# Patient Record
Sex: Female | Born: 1980 | Race: White | Hispanic: No | Marital: Married | Smoking: Never smoker
Health system: Southern US, Community
[De-identification: ages and names within clinical notes are randomized; demographics above are authoritative.]

## PROBLEM LIST (undated history)

## (undated) DIAGNOSIS — D649 Anemia, unspecified: Secondary | ICD-10-CM

## (undated) DIAGNOSIS — N200 Calculus of kidney: Secondary | ICD-10-CM

## (undated) DIAGNOSIS — R011 Cardiac murmur, unspecified: Secondary | ICD-10-CM

## (undated) DIAGNOSIS — K589 Irritable bowel syndrome without diarrhea: Secondary | ICD-10-CM

## (undated) DIAGNOSIS — F419 Anxiety disorder, unspecified: Secondary | ICD-10-CM

## (undated) HISTORY — DX: Cardiac murmur, unspecified: R01.1

## (undated) HISTORY — DX: Anxiety disorder, unspecified: F41.9

## (undated) HISTORY — DX: Anemia, unspecified: D64.9

## (undated) HISTORY — DX: Irritable bowel syndrome, unspecified: K58.9

## (undated) HISTORY — DX: Calculus of kidney: N20.0

## (undated) HISTORY — PX: KIDNEY STONE SURGERY: SHX686

---

## 2004-05-13 HISTORY — PX: CHOLECYSTECTOMY: SHX55

## 2017-01-20 ENCOUNTER — Emergency Department (HOSPITAL_COMMUNITY)
Admission: EM | Admit: 2017-01-20 | Discharge: 2017-01-21 | Disposition: A | Discharge status: Home / Self Care | Payer: Self-pay | Attending: Emergency Medicine | Admitting: Emergency Medicine

## 2017-01-20 ENCOUNTER — Emergency Department (HOSPITAL_COMMUNITY): Payer: Self-pay

## 2017-01-20 DIAGNOSIS — T59811A Toxic effect of smoke, accidental (unintentional), initial encounter: Secondary | ICD-10-CM

## 2017-01-20 DIAGNOSIS — Z77028 Contact with and (suspected) exposure to other hazardous aromatic compounds: Secondary | ICD-10-CM | POA: Insufficient documentation

## 2017-01-20 DIAGNOSIS — J705 Respiratory conditions due to smoke inhalation: Secondary | ICD-10-CM | POA: Insufficient documentation

## 2017-01-20 LAB — COMPREHENSIVE METABOLIC PANEL
ALT: 17 U/L (ref 14–54)
AST: 19 U/L (ref 15–41)
Albumin: 4.4 g/dL (ref 3.5–5.0)
Alkaline Phosphatase: 58 U/L (ref 38–126)
Anion gap: 8 (ref 5–15)
BUN: 12 mg/dL (ref 6–20)
CHLORIDE: 109 mmol/L (ref 101–111)
CO2: 23 mmol/L (ref 22–32)
CREATININE: 0.81 mg/dL (ref 0.44–1.00)
Calcium: 9.2 mg/dL (ref 8.9–10.3)
GFR calc Af Amer: >60 mL/min (ref >60)
GFR calc non Af Amer: >60 mL/min (ref >60)
Glucose, Bld: 88 mg/dL (ref 65–99)
Potassium: 3.9 mmol/L (ref 3.5–5.1)
SODIUM: 140 mmol/L (ref 135–145)
TOTAL PROTEIN: 7.4 g/dL (ref 6.5–8.1)
Total Bilirubin: 0.8 mg/dL (ref 0.3–1.2)

## 2017-01-20 LAB — I-STAT CHEM 8, ED
BUN: 13 mg/dL (ref 6–20)
CALCIUM ION: 1.06 mmol/L — AB (ref 1.15–1.40)
CHLORIDE: 107 mmol/L (ref 101–111)
Creatinine, Ser: 0.7 mg/dL (ref 0.44–1.00)
GLUCOSE: 91 mg/dL (ref 65–99)
HCT: 38 % (ref 36.0–46.0)
HEMOGLOBIN: 12.9 g/dL (ref 12.0–15.0)
POTASSIUM: 4 mmol/L (ref 3.5–5.1)
SODIUM: 144 mmol/L (ref 135–145)
TCO2: 25 mmol/L (ref 22–32)

## 2017-01-20 LAB — I-STAT ARTERIAL BLOOD GAS, ED
Acid-Base Excess: 1 mmol/L (ref 0.0–2.0)
Bicarbonate: 25.3 mmol/L (ref 20.0–28.0)
O2 SAT: 97 %
PCO2 ART: 36.3 mmHg (ref 32.0–48.0)
TCO2: 26 mmol/L (ref 22–32)
pH, Arterial: 7.45 (ref 7.350–7.450)
pO2, Arterial: 85 mmHg (ref 83.0–108.0)

## 2017-01-20 LAB — CBC WITH DIFFERENTIAL/PLATELET
BASOS ABS: 0.1 10*3/uL (ref 0.0–0.1)
Basophils Relative: 1 %
EOS ABS: 0.1 10*3/uL (ref 0.0–0.7)
EOS PCT: 1 %
HCT: 35.3 % — ABNORMAL LOW (ref 36.0–46.0)
Hemoglobin: 11.4 g/dL — ABNORMAL LOW (ref 12.0–15.0)
Lymphocytes Relative: 35 %
Lymphs Abs: 4 10*3/uL (ref 0.7–4.0)
MCH: 20.2 pg — ABNORMAL LOW (ref 26.0–34.0)
MCHC: 32.3 g/dL (ref 30.0–36.0)
MCV: 62.7 fL — ABNORMAL LOW (ref 78.0–100.0)
MONO ABS: 0.8 10*3/uL (ref 0.1–1.0)
Monocytes Relative: 7 %
NEUTROS PCT: 56 %
Neutro Abs: 6.3 10*3/uL (ref 1.7–7.7)
PLATELETS: 251 10*3/uL (ref 150–400)
RBC: 5.63 MIL/uL — AB (ref 3.87–5.11)
RDW: 15.7 % — ABNORMAL HIGH (ref 11.5–15.5)
WBC: 11.3 10*3/uL — AB (ref 4.0–10.5)

## 2017-01-20 LAB — RAPID URINE DRUG SCREEN, HOSP PERFORMED
Amphetamines: NOT DETECTED
Barbiturates: NOT DETECTED
Benzodiazepines: NOT DETECTED
Cocaine: NOT DETECTED
Opiates: NOT DETECTED
Tetrahydrocannabinol: NOT DETECTED

## 2017-01-20 LAB — ETHANOL

## 2017-01-20 LAB — I-STAT BETA HCG BLOOD, ED (MC, WL, AP ONLY)

## 2017-01-20 LAB — COOXEMETRY PANEL
CARBOXYHEMOGLOBIN: 0.7 % (ref 0.5–1.5)
Methemoglobin: 1.1 % (ref 0.0–1.5)
O2 SAT: 59.1 %
Total hemoglobin: 11.7 g/dL — ABNORMAL LOW (ref 12.0–16.0)

## 2017-01-20 LAB — CBG MONITORING, ED: Glucose-Capillary: 101 mg/dL — ABNORMAL HIGH (ref 65–99)
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
## 2017-01-21 NOTE — Discharge Instructions (Signed)
Follow up with your doctor as needed for any persistent cough or concerning symptoms. Return here as needed.

## 2017-01-21 NOTE — ED Notes (Signed)
PA explained tests results and plan of care to pt. 

## 2019-06-03 DIAGNOSIS — R7989 Other specified abnormal findings of blood chemistry: Secondary | ICD-10-CM | POA: Diagnosis not present

## 2019-06-03 DIAGNOSIS — N200 Calculus of kidney: Secondary | ICD-10-CM | POA: Diagnosis not present

## 2019-06-03 DIAGNOSIS — R1032 Left lower quadrant pain: Secondary | ICD-10-CM | POA: Diagnosis not present

## 2019-06-03 DIAGNOSIS — Z87442 Personal history of urinary calculi: Secondary | ICD-10-CM | POA: Diagnosis not present

## 2019-06-03 DIAGNOSIS — N23 Unspecified renal colic: Secondary | ICD-10-CM | POA: Diagnosis not present

## 2019-06-03 DIAGNOSIS — Z793 Long term (current) use of hormonal contraceptives: Secondary | ICD-10-CM | POA: Diagnosis not present

## 2019-06-09 ENCOUNTER — Emergency Department
Admission: EM | Admit: 2019-06-09 | Discharge: 2019-06-09 | Disposition: A | Discharge status: Home / Self Care | Payer: BC Managed Care – PPO | Source: Home / Self Care

## 2019-06-09 ENCOUNTER — Other Ambulatory Visit: Payer: Self-pay

## 2019-06-09 DIAGNOSIS — M545 Low back pain, unspecified: Secondary | ICD-10-CM

## 2019-06-09 MED ORDER — METHOCARBAMOL 500 MG PO TABS: 500.0000 mg | ORAL_TABLET | Freq: Four times a day (QID) | ORAL | 0 refills | Status: DC

## 2019-06-09 MED ORDER — VALACYCLOVIR HCL 1 G PO TABS: 1000.0000 mg | ORAL_TABLET | Freq: Three times a day (TID) | ORAL | 0 refills | Status: AC

## 2019-06-09 MED ORDER — HYDROCODONE-ACETAMINOPHEN 5-325 MG PO TABS: 1.0000 | ORAL_TABLET | ORAL | 0 refills | Status: AC | PRN

## 2019-06-09 NOTE — Discharge Instructions (Signed)
Fill prescription for valtrex if you develop a rash

## 2019-06-09 NOTE — ED Triage Notes (Signed)
Pt c/o lower LT sided back pain since last Thurs. Also c/o some itching and burning sensation on same side. Some LT sided neck tenderness and swelling. Says son had chicken pox last week and she is wondering if its shingles.

## 2019-06-10 NOTE — ED Provider Notes (Signed)
[facility]    CSN: [id number] Arrival date & time: 06/09/19  1749      History   Chief Complaint Chief Complaint  Patient presents with  . Back Pain    HPI Shiva Karis is a 39 y.o. female.   Pt complains of pain in her left low back.  Pt reports she was seen at [facility] 1/21 because she thoughts she had a kidney stone.  Pt reports she had a normal scan.  Pt reports pain is worse and she has a bump in area.  Pt is worried that she has shingles.  Reports that area is stinging is painful.  Patient reports her child recently had chickenpox.  Patient denies any fever she denies any chills she has not had any cough  The history is provided by the patient. No language interpreter was used.  Back Pain   History reviewed. No pertinent past medical history.  There are no problems to display for this patient.   History reviewed. No pertinent surgical history.  OB History   No obstetric history on file.      Home Medications    Prior to Admission medications   Medication Sig Start Date End Date Taking? Authorizing Provider  HYDROcodone-acetaminophen (NORCO/VICODIN) 5-325 MG tablet Take 1 tablet by mouth every 4 (four) hours as needed for moderate pain. 06/09/19 06/08/20  Fransico Meadow, PA-C  methocarbamol (ROBAXIN) 500 MG tablet Take 1 tablet (500 mg total) by mouth 4 (four) times daily. 06/09/19   Fransico Meadow, PA-C  norgestimate-ethinyl estradiol (ORTHO-CYCLEN) 0.25-35 MG-MCG tablet Take by mouth.    [provider]  valACYclovir (VALTREX) 1000 MG tablet Take 1 tablet (1,000 mg total) by mouth 3 (three) times daily for 10 days. 06/09/19 06/19/19  Fransico Meadow, PA-C    Family History History reviewed. No pertinent family history.  Social History Social History   Tobacco Use  . Smoking status: Never Smoker  . Smokeless tobacco: Never Used  Substance Use Topics  . Alcohol use: Not on file  . Drug use: Not on file     Allergies     Patient has no known allergies.   Review of Systems Review of Systems  Musculoskeletal: Positive for back pain.  All other systems reviewed and are negative.    Physical Exam Triage Vital Signs ED Triage Vitals  Enc Vitals Group     BP 06/09/19 1830 127/81     Pulse Rate 06/09/19 1830 62     Resp 06/09/19 1830 18     Temp 06/09/19 1830 98.1 F (36.7 C)     Temp Source 06/09/19 1830 Oral     SpO2 06/09/19 1830 99 %     Weight 06/09/19 1831 168 lb (76.2 kg)     Height 06/09/19 1831 5\' 4"  (1.626 m)     Head Circumference --      Peak Flow --      Pain Score 06/09/19 1831 6     Pain Loc --      Pain Edu? --      Excl. in [place]? --    No data found.  Updated Vital Signs BP 127/81 (BP Location: Right Arm)   Pulse 62   Temp 98.1 F (36.7 C) (Oral)   Resp 18   Ht 5\' 4"  (1.626 m)   Wt 76.2 kg   LMP 06/09/2019 (Exact Date)   SpO2 99%   BMI 28.84 kg/m   Visual Acuity Right Eye  Distance:   Left Eye Distance:   Bilateral Distance:    Right Eye Near:   Left Eye Near:    Bilateral Near:     Physical Exam Vitals and nursing note reviewed.  Constitutional:      Appearance: She is well-developed.  HENT:     Head: Normocephalic.  Cardiovascular:     Rate and Rhythm: Normal rate.  Pulmonary:     Effort: Pulmonary effort is normal.  Abdominal:     General: There is no distension.  Musculoskeletal:        General: Normal range of motion.     Cervical back: Normal range of motion.  Neurological:     General: No focal deficit present.     Mental Status: She is alert and oriented to person, place, and time.  Psychiatric:        Mood and Affect: Mood normal.      UC Treatments / Results  Labs (all labs ordered are listed, but only abnormal results are displayed) Labs Reviewed - No data to display  EKG   Radiology No results found.  Procedures Procedures (including critical care time)  Medications Ordered in UC Medications - No data to  display  Initial Impression / Assessment and Plan / UC Course  I have reviewed the triage vital signs and the nursing notes.  Pertinent labs & imaging results that were available during my care of the patient were reviewed by me and considered in my medical decision making (see chart for details).     MDM: no rash. I doubt shingles.  Pt given rx for valtrex and advised only to fill if a rash. Final Clinical Impressions(s) / UC Diagnoses   Final diagnoses:  Acute left-sided low back pain without sciatica     Discharge Instructions     Fill prescription for valtrex if you develop a rash    ED Prescriptions    Medication Sig Dispense Auth. Provider   valACYclovir (VALTREX) 1000 MG tablet Take 1 tablet (1,000 mg total) by mouth 3 (three) times daily for 10 days. 30 tablet Neisha Hinger K, [place]   methocarbamol (ROBAXIN) 500 MG tablet Take 1 tablet (500 mg total) by mouth 4 (four) times daily. 20 tablet Flois Mctague K, [place]   HYDROcodone-acetaminophen (NORCO/VICODIN) 5-325 MG tablet Take 1 tablet by mouth every 4 (four) hours as needed for moderate pain. 12 tablet Elson Areas, [place]     I have reviewed the PDMP during this encounter.  An After Visit Summary was printed and given to the patient.    Elson Areas, [place] 06/10/19 1327

## 2019-08-12 DIAGNOSIS — H5213 Myopia, bilateral: Secondary | ICD-10-CM | POA: Diagnosis not present

## 2019-09-13 DIAGNOSIS — Z20828 Contact with and (suspected) exposure to other viral communicable diseases: Secondary | ICD-10-CM | POA: Diagnosis not present

## 2019-09-13 DIAGNOSIS — Z03818 Encounter for observation for suspected exposure to other biological agents ruled out: Secondary | ICD-10-CM | POA: Diagnosis not present

## 2019-09-23 DIAGNOSIS — R0602 Shortness of breath: Secondary | ICD-10-CM | POA: Diagnosis not present

## 2019-09-23 DIAGNOSIS — M940 Chondrocostal junction syndrome [Tietze]: Secondary | ICD-10-CM | POA: Diagnosis not present

## 2019-09-24 DIAGNOSIS — R5081 Fever presenting with conditions classified elsewhere: Secondary | ICD-10-CM | POA: Diagnosis not present

## 2019-09-24 DIAGNOSIS — J209 Acute bronchitis, unspecified: Secondary | ICD-10-CM | POA: Diagnosis not present

## 2019-09-26 DIAGNOSIS — Z20828 Contact with and (suspected) exposure to other viral communicable diseases: Secondary | ICD-10-CM | POA: Diagnosis not present

## 2019-09-26 DIAGNOSIS — Z03818 Encounter for observation for suspected exposure to other biological agents ruled out: Secondary | ICD-10-CM | POA: Diagnosis not present

## 2019-12-13 DIAGNOSIS — N760 Acute vaginitis: Secondary | ICD-10-CM | POA: Diagnosis not present

## 2020-01-25 DIAGNOSIS — D649 Anemia, unspecified: Secondary | ICD-10-CM | POA: Diagnosis not present

## 2020-01-25 DIAGNOSIS — R5383 Other fatigue: Secondary | ICD-10-CM | POA: Diagnosis not present

## 2020-05-29 DIAGNOSIS — J0100 Acute maxillary sinusitis, unspecified: Secondary | ICD-10-CM | POA: Diagnosis not present

## 2020-05-29 DIAGNOSIS — J029 Acute pharyngitis, unspecified: Secondary | ICD-10-CM | POA: Diagnosis not present

## 2020-05-29 DIAGNOSIS — Z6831 Body mass index (BMI) 31.0-31.9, adult: Secondary | ICD-10-CM | POA: Diagnosis not present

## 2020-06-01 DIAGNOSIS — Z20822 Contact with and (suspected) exposure to covid-19: Secondary | ICD-10-CM | POA: Diagnosis not present

## 2020-06-13 HISTORY — PX: COLONOSCOPY W/ BIOPSIES AND POLYPECTOMY: SHX1376

## 2020-06-14 DIAGNOSIS — R109 Unspecified abdominal pain: Secondary | ICD-10-CM | POA: Diagnosis not present

## 2020-06-14 DIAGNOSIS — R1013 Epigastric pain: Secondary | ICD-10-CM | POA: Diagnosis not present

## 2020-06-14 DIAGNOSIS — Z20822 Contact with and (suspected) exposure to covid-19: Secondary | ICD-10-CM | POA: Diagnosis not present

## 2020-06-14 DIAGNOSIS — Z79899 Other long term (current) drug therapy: Secondary | ICD-10-CM | POA: Diagnosis not present

## 2020-06-14 DIAGNOSIS — Z9049 Acquired absence of other specified parts of digestive tract: Secondary | ICD-10-CM | POA: Diagnosis not present

## 2020-06-14 DIAGNOSIS — Z793 Long term (current) use of hormonal contraceptives: Secondary | ICD-10-CM | POA: Diagnosis not present

## 2020-06-14 DIAGNOSIS — R1011 Right upper quadrant pain: Secondary | ICD-10-CM | POA: Diagnosis not present

## 2020-06-14 DIAGNOSIS — R110 Nausea: Secondary | ICD-10-CM | POA: Diagnosis not present

## 2020-06-26 DIAGNOSIS — R1011 Right upper quadrant pain: Secondary | ICD-10-CM | POA: Diagnosis not present

## 2020-06-26 DIAGNOSIS — A09 Infectious gastroenteritis and colitis, unspecified: Secondary | ICD-10-CM | POA: Diagnosis not present

## 2020-06-26 DIAGNOSIS — K921 Melena: Secondary | ICD-10-CM | POA: Diagnosis not present

## 2020-06-27 DIAGNOSIS — R1011 Right upper quadrant pain: Secondary | ICD-10-CM | POA: Diagnosis not present

## 2020-06-28 DIAGNOSIS — K2281 Esophageal polyp: Secondary | ICD-10-CM | POA: Diagnosis not present

## 2020-06-28 DIAGNOSIS — R1011 Right upper quadrant pain: Secondary | ICD-10-CM | POA: Diagnosis not present

## 2020-06-28 DIAGNOSIS — R197 Diarrhea, unspecified: Secondary | ICD-10-CM | POA: Diagnosis not present

## 2020-06-28 DIAGNOSIS — K648 Other hemorrhoids: Secondary | ICD-10-CM | POA: Diagnosis not present

## 2020-06-28 DIAGNOSIS — K2289 Other specified disease of esophagus: Secondary | ICD-10-CM | POA: Diagnosis not present

## 2020-06-28 DIAGNOSIS — R109 Unspecified abdominal pain: Secondary | ICD-10-CM | POA: Diagnosis not present

## 2020-06-28 DIAGNOSIS — D124 Benign neoplasm of descending colon: Secondary | ICD-10-CM | POA: Diagnosis not present

## 2020-06-28 DIAGNOSIS — K921 Melena: Secondary | ICD-10-CM | POA: Diagnosis not present

## 2020-10-02 DIAGNOSIS — J029 Acute pharyngitis, unspecified: Secondary | ICD-10-CM | POA: Diagnosis not present

## 2020-10-02 DIAGNOSIS — J020 Streptococcal pharyngitis: Secondary | ICD-10-CM | POA: Diagnosis not present

## 2020-10-02 DIAGNOSIS — Z6832 Body mass index (BMI) 32.0-32.9, adult: Secondary | ICD-10-CM | POA: Diagnosis not present

## 2021-04-09 ENCOUNTER — Ambulatory Visit: Payer: BC Managed Care – PPO | Admitting: Family Medicine

## 2021-04-16 ENCOUNTER — Ambulatory Visit: Payer: BC Managed Care – PPO | Admitting: Family Medicine

## 2021-04-16 ENCOUNTER — Ambulatory Visit (INDEPENDENT_AMBULATORY_CARE_PROVIDER_SITE_OTHER): Payer: BC Managed Care – PPO

## 2021-04-16 ENCOUNTER — Encounter: Payer: Self-pay | Admitting: Family Medicine

## 2021-04-16 VITALS — BP 113/71 | HR 88 | Temp 98.4°F | Ht 64.0 in | Wt 207.1 lb

## 2021-04-16 DIAGNOSIS — Z1159 Encounter for screening for other viral diseases: Secondary | ICD-10-CM

## 2021-04-16 DIAGNOSIS — M79652 Pain in left thigh: Secondary | ICD-10-CM | POA: Diagnosis not present

## 2021-04-16 DIAGNOSIS — Z7689 Persons encountering health services in other specified circumstances: Secondary | ICD-10-CM

## 2021-04-16 DIAGNOSIS — E559 Vitamin D deficiency, unspecified: Secondary | ICD-10-CM | POA: Diagnosis not present

## 2021-04-16 DIAGNOSIS — D563 Thalassemia minor: Secondary | ICD-10-CM | POA: Diagnosis not present

## 2021-04-16 DIAGNOSIS — Z6835 Body mass index (BMI) 35.0-35.9, adult: Secondary | ICD-10-CM

## 2021-04-16 DIAGNOSIS — E6609 Other obesity due to excess calories: Secondary | ICD-10-CM | POA: Diagnosis not present

## 2021-04-16 DIAGNOSIS — F411 Generalized anxiety disorder: Secondary | ICD-10-CM

## 2021-04-16 DIAGNOSIS — G2581 Restless legs syndrome: Secondary | ICD-10-CM | POA: Diagnosis not present

## 2021-04-16 DIAGNOSIS — Z114 Encounter for screening for human immunodeficiency virus [HIV]: Secondary | ICD-10-CM | POA: Diagnosis not present

## 2021-04-16 DIAGNOSIS — Z23 Encounter for immunization: Secondary | ICD-10-CM | POA: Diagnosis not present

## 2021-04-16 DIAGNOSIS — R102 Pelvic and perineal pain: Secondary | ICD-10-CM | POA: Diagnosis not present

## 2021-04-16 DIAGNOSIS — F321 Major depressive disorder, single episode, moderate: Secondary | ICD-10-CM

## 2021-04-16 DIAGNOSIS — E538 Deficiency of other specified B group vitamins: Secondary | ICD-10-CM | POA: Diagnosis not present

## 2021-04-16 MED ORDER — BUSPIRONE HCL 10 MG PO TABS: 10.0000 mg | ORAL_TABLET | Freq: Two times a day (BID) | ORAL | 2 refills | Status: DC

## 2021-04-16 MED ORDER — ROPINIROLE HCL 0.25 MG PO TABS: 0.2500 mg | ORAL_TABLET | Freq: Every day | ORAL | 1 refills | Status: DC

## 2021-04-16 NOTE — Progress Notes (Signed)
New Patient Office Visit  Subjective:  Patient ID: Amanda Clarke, female    DOB: 08/19/1980  Age: 40 y.o. MRN: [id number]  CC:  Chief Complaint  Patient presents with   New Patient (Initial Visit)    HPI Amanda Clarke presents to establish care.   She reports a constant need to move her legs at night or when she is resting. She also reports jerking and reports this makes it difficult. She reports that this has been going on for years. She has not tired any remedies for this. She takes a daily iron supplement. She has a history of thalassemia trait so her iron stores are often low.   She also reports intermittent sharp pain down the anterior of her left thigh. This occurs with certain movements such as getting out of the car or abduction of her left hip. She denies numbness, tingling, injury, or saddle anesthesia. This has been going on for 6 months. She has intermittent pain in her lower back. She has had sciatica in the past but this feels different.   She also reports a history of vitamin b12 and vitamin d deficiency.    She also has a history of anxiety. She has been under increased stress for the last few months due to some changes in her family situation. She has never taken medication for this. She does not feel like depression symptoms are really bothersome in her life but she does feel like anxiety affects her life.   Depression screen PHQ 2/9 04/16/2021  Decreased Interest 1  Down, Depressed, Hopeless 1  PHQ - 2 Score 2  Altered sleeping 2  Tired, decreased energy 3  Change in appetite 2  Feeling bad or failure about yourself  1  Trouble concentrating 0  Moving slowly or fidgety/restless 0  Suicidal thoughts 0  PHQ-9 Score 10  Difficult doing work/chores Somewhat difficult   GAD 7 : Generalized Anxiety Score 04/16/2021  Nervous, Anxious, on Edge 3  Control/stop worrying 2  Worry too much - different things 2  Trouble relaxing 2  Restless 1  Easily annoyed or  irritable 3  Afraid - awful might happen 1  Total GAD 7 Score 14  Anxiety Difficulty Somewhat difficult      Past Medical History:  Diagnosis Date   Anxiety    IBS (irritable bowel syndrome)    Recurrent kidney stones     Past Surgical History:  Procedure Laterality Date   CHOLECYSTECTOMY  2006   COLONOSCOPY W/ BIOPSIES AND POLYPECTOMY  06/2020   needs to repeat in 5 years   KIDNEY STONE SURGERY      Family History  Problem Relation Age of Onset   Depression Mother    Vision loss Father    Kidney disease Father    Heart disease Father    Diabetes Father     Social History   Socioeconomic History   Marital status: Married    Spouse name: Amanda Clarke   Number of children: 2   Years of education: 12   Highest education level: High school graduate  Occupational History   Not on file  Tobacco Use   Smoking status: Never   Smokeless tobacco: Never  Vaping Use   Vaping Use: Never used  Substance and Sexual Activity   Alcohol use: Yes    Alcohol/week: 1.0 standard drink    Types: 1 Glasses of wine per week   Drug use: Not Currently   Sexual activity: Yes  Birth control/protection: None  Other Topics Concern   Not on file  Social History Narrative   Not on file   Social Determinants of Health   Financial Resource Strain: Not on file  Food Insecurity: Not on file  Transportation Needs: Not on file  Physical Activity: Not on file  Stress: Not on file  Social Connections: Not on file  Intimate Partner Violence: Not on file    ROS Review of Systems Negative unless specially indicated above in HPI.  Objective:   Today's Vitals: BP 113/71   Pulse 88   Temp 98.4 F (36.9 C) (Temporal)   Ht _0  (1.626 m)   Wt 207 lb 2 oz (94 kg)   BMI 35.55 kg/m   Physical Exam Vitals and nursing note reviewed.  Constitutional:      General: She is not in acute distress.    Appearance: She is not ill-appearing, toxic-appearing or diaphoretic.  Neck:      Thyroid: No thyroid mass, thyromegaly or thyroid tenderness.  Cardiovascular:     Rate and Rhythm: Normal rate and regular rhythm.     Heart sounds: Normal heart sounds. No murmur heard. Pulmonary:     Effort: Pulmonary effort is normal. No respiratory distress.     Breath sounds: Normal breath sounds.  Abdominal:     General: Bowel sounds are normal. There is no distension.     Palpations: Abdomen is soft.     Tenderness: There is no abdominal tenderness. There is no guarding or rebound.  Musculoskeletal:     Cervical back: Normal, normal range of motion and neck supple.     Thoracic back: Normal.     Lumbar back: No edema or signs of trauma. Normal range of motion. Positive left straight leg raise test.     Right lower leg: No edema.     Left lower leg: No edema.     Comments: Tenderness to left SI joint.   Skin:    General: Skin is warm and dry.  Neurological:     General: No focal deficit present.     Mental Status: She is alert and oriented to person, place, and time.  Psychiatric:        Mood and Affect: Mood normal.    Assessment & Plan:   Clay was seen today for new patient (initial visit).  Diagnoses and all orders for this visit:  Restless leg syndrome Labs pending. Will try requip. She will let me know how she responds to this after 1 week to see if dose needs to be titrated.  -     Anemia Profile B -     CMP14+EGFR -     rOPINIRole (REQUIP) 0.25 MG tablet; Take 1 tablet (0.25 mg total) by mouth at bedtime.  Thalassemia trait Labs pending.  -     Anemia Profile B  Vitamin B12 deficiency On oral supplement. Labs pending.  -     Vitamin B12  Vitamin D deficiency On oral supplement. Labs pending.  -     VITAMIN D 25 Hydroxy (Vit-D Deficiency, Fractures)  Left thigh pain Discussed hip vs back as cause of pain. There is left sided SI joint tenderness and positive SLR on left side. Xray of pelvis pending.  -     DG Pelvis 1-2 Views; Future  Depression,  major, single episode, moderate ([place]) Declined treatment today. Denies SI.   Generalized anxiety disorder Start buspar BID as below.  -  busPIRone (BUSPAR) 10 MG tablet; Take 1 tablet (10 mg total) by mouth 2 (two) times daily.  Class 2 obesity due to excess calories without serious comorbidity with body mass index (BMI) of 35.0 to 35.9 in adult Diet and exercise. Labs pending. She did have coffee with cream and sugar 2.5 hours prior to labs today.  -     Anemia Profile B -     CMP14+EGFR -     Lipid panel -     TSH  Need for hepatitis C screening test -     Hepatitis C antibody  Encounter for screening for HIV -     HIV antibody (with reflex)  Need for vaccination Tdap today in office.  -     Tdap vaccine greater than or equal to 7yo IM  Need for immunization against influenza Flu vaccine today in office.  -     Flu Vaccine QUAD 63moIM (Fluarix, Fluzone & Alfiuria Quad PF)  Encounter to establish care Awaiting records.    Follow-up: Return in about 6 weeks (around 05/28/2021) for medication follow up.   The patient indicates understanding of these issues and agrees with the plan.  TGwenlyn Perking FNP

## 2021-04-16 NOTE — Patient Instructions (Signed)
Restless Legs Syndrome ?Restless legs syndrome is a condition that causes uncomfortable feelings or sensations in the legs, especially while sitting or lying down. The sensations usually cause an overwhelming urge to move the legs. The arms can also sometimes be affected. ?The condition can range from mild to severe. The symptoms often interfere with a person's ability to sleep. ?What are the causes? ?The cause of this condition is not known. ?What increases the risk? ?The following factors may make you more likely to develop this condition: ?Being older than 50. ?Pregnancy. ?Being a woman. In general, the condition is more common in women than in men. ?A family history of the condition. ?Having iron deficiency. ?Overuse of caffeine, nicotine, or alcohol. ?Certain medical conditions, such as kidney disease, Parkinson's disease, or nerve damage. ?Certain medicines, such as those for high blood pressure, nausea, colds, allergies, depression, and some heart conditions. ?What are the signs or symptoms? ?The main symptom of this condition is uncomfortable sensations in the legs, such as: ?Pulling. ?Tingling. ?Prickling. ?Throbbing. ?Crawling. ?Burning. ?Usually, the sensations: ?Affect both sides of the body. ?Are worse when you sit or lie down. ?Are worse at night. These may make it difficult to fall asleep. ?Make you have a strong urge to move your legs. ?Are temporarily relieved by moving your legs or standing. ?The arms can also be affected, but this is rare. People who have this condition often have tiredness during the day because of their lack of sleep at night. ?How is this diagnosed? ?This condition may be diagnosed based on: ?Your symptoms. ?Blood tests. ?In some cases, you may be monitored in a sleep lab by a specialist (a sleep study). This can detect any disruptions in your sleep. ?How is this treated? ?This condition is treated by managing the symptoms. This may include: ?Lifestyle changes, such as  exercising, using relaxation techniques, and avoiding caffeine, alcohol, or tobacco. ?Iron supplements. ?Medicines. Parkinson's medications may be tried first. Anti-seizure medications can also be helpful. ?Follow these instructions at home: ?General instructions ?Take over-the-counter and prescription medicines only as told by your health care provider. ?Use methods to help relieve the uncomfortable sensations, such as: ?Massaging your legs. ?Walking or stretching. ?Taking a cold or hot bath. ?Keep all follow-up visits. This is important. ?Lifestyle ?  ?Practice good sleep habits. For example, go to bed and get up at the same time every day. Most adults should get 7-9 hours of sleep each night. ?Exercise regularly. Try to get at least 30 minutes of exercise most days of the week. ?Practice ways of relaxing, such as yoga or meditation. ?Avoid caffeine and alcohol. ?Do not use any products that contain nicotine or tobacco. These products include cigarettes, chewing tobacco, and vaping devices, such as e-cigarettes. If you need help quitting, ask your health care provider. ?Where to find more information ?National Institute of Neurological Disorders and Stroke: www.ninds.nih.gov ?Contact a health care provider if: ?Your symptoms get worse or they do not improve with treatment. ?Summary ?Restless legs syndrome is a condition that causes uncomfortable feelings or sensations in the legs, especially while sitting or lying down. ?The symptoms often interfere with your ability to sleep. ?This condition is treated by managing the symptoms. You may need to make lifestyle changes or take medicines. ?This information is not intended to replace advice given to you by your health care provider. Make sure you discuss any questions you have with your health care provider. ?Document Revised: 12/10/2020 Document Reviewed: 12/10/2020 ?Elsevier Patient Education ? 2022   Elsevier Inc. ? ?

## 2021-04-17 LAB — LIPID PANEL
Chol/HDL Ratio: 2.4 ratio (ref 0.0–4.4)
Cholesterol, Total: 148 mg/dL (ref 100–199)
HDL: 62 mg/dL (ref >39)
LDL Chol Calc (NIH): 72 mg/dL (ref 0–99)
Triglycerides: 74 mg/dL (ref 0–149)
VLDL Cholesterol Cal: 14 mg/dL (ref 5–40)

## 2021-04-17 LAB — CMP14+EGFR
ALT: 20 IU/L (ref 0–32)
AST: 16 IU/L (ref 0–40)
Albumin/Globulin Ratio: 1.6 (ref 1.2–2.2)
Albumin: 4.3 g/dL (ref 3.8–4.8)
Alkaline Phosphatase: 104 IU/L (ref 44–121)
BUN/Creatinine Ratio: 18 (ref 9–23)
BUN: 12 mg/dL (ref 6–20)
Bilirubin Total: 0.4 mg/dL (ref 0.0–1.2)
CO2: 22 mmol/L (ref 20–29)
Calcium: 9.2 mg/dL (ref 8.7–10.2)
Chloride: 105 mmol/L (ref 96–106)
Creatinine, Ser: 0.66 mg/dL (ref 0.57–1.00)
Globulin, Total: 2.7 g/dL (ref 1.5–4.5)
Glucose: 86 mg/dL (ref 70–99)
Potassium: 4.4 mmol/L (ref 3.5–5.2)
Sodium: 140 mmol/L (ref 134–144)
Total Protein: 7 g/dL (ref 6.0–8.5)
eGFR: 115 mL/min/{1.73_m2} (ref >59)

## 2021-04-17 LAB — ANEMIA PROFILE B
Basophils Absolute: 0 10*3/uL (ref 0.0–0.2)
Basos: 0 %
EOS (ABSOLUTE): 0.1 10*3/uL (ref 0.0–0.4)
Eos: 1 %
Ferritin: 156 ng/mL — ABNORMAL HIGH (ref 15–150)
Folate: 11.8 ng/mL (ref >3.0)
Hematocrit: 37.6 % (ref 34.0–46.6)
Hemoglobin: 11.5 g/dL (ref 11.1–15.9)
Immature Grans (Abs): 0 10*3/uL (ref 0.0–0.1)
Immature Granulocytes: 0 %
Iron Saturation: 28 % (ref 15–55)
Iron: 82 ug/dL (ref 27–159)
Lymphocytes Absolute: 2.3 10*3/uL (ref 0.7–3.1)
Lymphs: 25 %
MCH: 19.9 pg — ABNORMAL LOW (ref 26.6–33.0)
MCHC: 30.6 g/dL — ABNORMAL LOW (ref 31.5–35.7)
MCV: 65 fL — ABNORMAL LOW (ref 79–97)
Monocytes Absolute: 0.7 10*3/uL (ref 0.1–0.9)
Monocytes: 7 %
Neutrophils Absolute: 6.4 10*3/uL (ref 1.4–7.0)
Neutrophils: 67 %
Platelets: 372 10*3/uL (ref 150–450)
RBC: 5.77 x10E6/uL — ABNORMAL HIGH (ref 3.77–5.28)
RDW: 17.9 % — ABNORMAL HIGH (ref 11.7–15.4)
Retic Ct Pct: 2.3 % (ref 0.6–2.6)
Total Iron Binding Capacity: 296 ug/dL (ref 250–450)
UIBC: 214 ug/dL (ref 131–425)
Vitamin B-12: 1556 pg/mL — ABNORMAL HIGH (ref 232–1245)
WBC: 9.5 10*3/uL (ref 3.4–10.8)

## 2021-04-17 LAB — TSH: TSH: 1.96 u[IU]/mL (ref 0.450–4.500)

## 2021-04-17 LAB — VITAMIN D 25 HYDROXY (VIT D DEFICIENCY, FRACTURES): Vit D, 25-Hydroxy: 55.7 ng/mL (ref 30.0–100.0)

## 2021-04-17 LAB — HEPATITIS C ANTIBODY: Hep C Virus Ab: <0.1 s/co ratio (ref 0.0–0.9)

## 2021-04-17 LAB — HIV ANTIBODY (ROUTINE TESTING W REFLEX): HIV Screen 4th Generation wRfx: NONREACTIVE

## 2021-04-18 NOTE — Addendum Note (Signed)
Addended by: Gabriel Earing on: 04/18/2021 09:55 AM   Modules accepted: Orders

## 2021-04-23 ENCOUNTER — Other Ambulatory Visit: Payer: Self-pay | Admitting: Family Medicine

## 2021-04-23 ENCOUNTER — Telehealth: Payer: Self-pay | Admitting: Family Medicine

## 2021-04-23 ENCOUNTER — Other Ambulatory Visit: Payer: Self-pay

## 2021-04-23 DIAGNOSIS — G2581 Restless legs syndrome: Secondary | ICD-10-CM

## 2021-04-23 MED ORDER — ROPINIROLE HCL 0.5 MG PO TABS: 0.5000 mg | ORAL_TABLET | Freq: Every day | ORAL | 1 refills | Status: DC

## 2021-04-23 NOTE — Telephone Encounter (Signed)
Left message informing pt and to call back if needed. 

## 2021-04-23 NOTE — Telephone Encounter (Signed)
0.5 mg tablet sent in. She can increase to 1 mg after 1 week if needed.

## 2021-04-23 NOTE — Telephone Encounter (Signed)
Pt called stating that she will try taking 2 (0.5mg ) tablets of her Ropinirole as advised and see if that helps her. Says she will call back to let us know so that we can send 1mg  Rx to pharmacy.  Pt says we sent Rx to wrong pharmacy today. Should have went to [facility].

## 2021-04-23 NOTE — Telephone Encounter (Signed)
Sent Requip to [facility]

## 2021-04-26 ENCOUNTER — Ambulatory Visit: Payer: BC Managed Care – PPO | Attending: Family Medicine

## 2021-04-27 ENCOUNTER — Other Ambulatory Visit: Payer: Self-pay | Admitting: Family Medicine

## 2021-04-27 DIAGNOSIS — G2581 Restless legs syndrome: Secondary | ICD-10-CM

## 2021-04-27 MED ORDER — ROPINIROLE HCL 1 MG PO TABS
1.0000 mg | ORAL_TABLET | Freq: Every day | ORAL | 1 refills | Status: DC
Start: 2021-04-27 — End: 2021-08-13

## 2021-05-28 ENCOUNTER — Encounter: Payer: Self-pay | Admitting: Family Medicine

## 2021-05-28 ENCOUNTER — Ambulatory Visit: Payer: BC Managed Care – PPO | Admitting: Family Medicine

## 2021-05-28 VITALS — BP 103/65 | HR 89 | Temp 97.9°F | Ht 64.0 in | Wt 211.2 lb

## 2021-05-28 DIAGNOSIS — F321 Major depressive disorder, single episode, moderate: Secondary | ICD-10-CM | POA: Diagnosis not present

## 2021-05-28 DIAGNOSIS — F411 Generalized anxiety disorder: Secondary | ICD-10-CM | POA: Diagnosis not present

## 2021-05-28 DIAGNOSIS — K2100 Gastro-esophageal reflux disease with esophagitis, without bleeding: Secondary | ICD-10-CM

## 2021-05-28 MED ORDER — DULOXETINE HCL 30 MG PO CPEP: 30.0000 mg | ORAL_CAPSULE | Freq: Every day | ORAL | 0 refills | Status: DC

## 2021-05-28 MED ORDER — ESOMEPRAZOLE MAGNESIUM 40 MG PO CPDR: 40.0000 mg | DELAYED_RELEASE_CAPSULE | Freq: Every day | ORAL | 5 refills | Status: DC
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
## 2021-06-04 ENCOUNTER — Encounter: Payer: Self-pay | Admitting: Nurse Practitioner

## 2021-06-04 ENCOUNTER — Ambulatory Visit: Payer: BC Managed Care – PPO | Admitting: Nurse Practitioner

## 2021-06-04 DIAGNOSIS — J069 Acute upper respiratory infection, unspecified: Secondary | ICD-10-CM | POA: Diagnosis not present

## 2021-06-04 MED ORDER — FLUTICASONE PROPIONATE 50 MCG/ACT NA SUSP: 2.0000 | Freq: Every day | NASAL | 6 refills | Status: DC

## 2021-06-04 NOTE — Progress Notes (Signed)
Virtual Visit  Note Due to COVID-19 pandemic this visit was conducted virtually. This visit type was conducted due to national recommendations for restrictions regarding the COVID-19 Pandemic (e.g. social distancing, sheltering in place) in an effort to limit this patient's exposure and mitigate transmission in our community. All issues noted in this document were discussed and addressed.  A physical exam was not performed with this format.  I connected with Amanda Clarke on 06/04/21 at 1:24 by telephone and verified that I am speaking with the correct person using two identifiers. Amanda Clarke is currently located at home and her kids is currently with her during visit. The provider, Mary-Margaret Hassell Done, FNP is located in their office at time of visit.  I discussed the limitations, risks, security and privacy concerns of performing an evaluation and management service by telephone and the availability of in person appointments. I also discussed with the patient that there may be a patient responsible charge related to this service. The patient expressed understanding and agreed to proceed.   History and Present Illness:  URI  The current episode started in the past 7 days. The problem has been unchanged. The maximum temperature recorded prior to her arrival was 103 - 104 F. The fever has been present for 3 to 4 days. Associated symptoms include congestion, coughing, headaches, rhinorrhea and sneezing. Pertinent negatives include no sinus pain. She has tried decongestant, acetaminophen and NSAIDs for the symptoms. The treatment provided moderate relief. Temp has come doen to 100 today. 3 home covid tests were negative.    Review of Systems  HENT:  Positive for congestion, rhinorrhea and sneezing. Negative for sinus pain.   Respiratory:  Positive for cough.   Neurological:  Positive for headaches.    Observations/Objective: Alert and oriented- answers all questions  appropriately No distress Raspy voice No cough  Assessment and Plan: Amanda Clarke in today with chief complaint of URI   1. URI with cough and congestion Patient probably had the flu and first onset with such a high fever. Out of treatment window for tamiflu 1. Take meds as prescribed 2. Use a cool mist humidifier especially during the winter months and when heat has been humid. 3. Use saline nose sprays frequently 4. Saline irrigations of the nose can be very helpful if done frequently.  * 4X daily for 1 week*  * Use of a nettie pot can be helpful with this. Follow directions with this* 5. Drink plenty of fluids 6. Keep thermostat turn down low 7.For any cough or congestion- delsym 8. For fever or aces or pains- take tylenol or ibuprofen appropriate for age and weight.  * for fevers greater than 101 orally you may alternate ibuprofen and tylenol every  3 hours. Add claritin daily to meds  Meds ordered this encounter  Medications   fluticasone (FLONASE) 50 MCG/ACT nasal spray    Sig: Place 2 sprays into both nostrils daily.    Dispense:  16 g    Refill:  6    Order Specific Question:   Supervising Provider    Answer:   Caryl Pina A [id number]   If fever spikes again, will NTBS    Follow Up Instructions: prn    I discussed the assessment and treatment plan with the patient. The patient was provided an opportunity to ask questions and all were answered. The patient agreed with the plan and demonstrated an understanding of the instructions.   The patient was advised to call  back or seek an in-person evaluation if the symptoms worsen or if the condition fails to improve as anticipated.  The above assessment and management plan was discussed with the patient. The patient verbalized understanding of and has agreed to the management plan. Patient is aware to call the clinic if symptoms persist or worsen. Patient is aware when to return to the clinic for a follow-up  visit. Patient educated on when it is appropriate to go to the emergency department.   Time call ended:  1:36  I provided 12 minutes of  non face-to-face time during this encounter.    Mary-Margaret Hassell Done, FNP

## 2021-06-05 ENCOUNTER — Other Ambulatory Visit: Payer: Self-pay | Admitting: Family Medicine

## 2021-06-05 DIAGNOSIS — G2581 Restless legs syndrome: Secondary | ICD-10-CM

## 2021-07-02 ENCOUNTER — Ambulatory Visit: Payer: BC Managed Care – PPO | Admitting: Family Medicine

## 2021-07-02 ENCOUNTER — Encounter: Payer: Self-pay | Admitting: Family Medicine

## 2021-07-02 VITALS — BP 114/73 | HR 115 | Temp 98.3°F | Ht 64.0 in | Wt 210.0 lb

## 2021-07-02 DIAGNOSIS — K2100 Gastro-esophageal reflux disease with esophagitis, without bleeding: Secondary | ICD-10-CM

## 2021-07-02 DIAGNOSIS — L2084 Intrinsic (allergic) eczema: Secondary | ICD-10-CM

## 2021-07-02 DIAGNOSIS — F321 Major depressive disorder, single episode, moderate: Secondary | ICD-10-CM

## 2021-07-02 MED ORDER — MIRTAZAPINE 30 MG PO TABS: 30.0000 mg | ORAL_TABLET | Freq: Every day | ORAL | 2 refills | Status: DC

## 2021-07-02 MED ORDER — FLUOCINONIDE 0.05 % EX CREA: 1.0000 "application " | TOPICAL_CREAM | Freq: Two times a day (BID) | CUTANEOUS | 5 refills | Status: DC

## 2021-07-02 MED ORDER — ESOMEPRAZOLE MAGNESIUM 40 MG PO CPDR: 40.0000 mg | DELAYED_RELEASE_CAPSULE | Freq: Two times a day (BID) | ORAL | 5 refills | Status: DC

## 2021-07-02 NOTE — Progress Notes (Signed)
Subjective:  Patient ID: Amanda Clarke, female    DOB: 01/30/1981  Age: 41 y.o. MRN: [id number]  CC: Follow-up (depression)   HPI Amanda Clarke presents for depression. No better. Would rather just stay in bed all the time. Trying to avoid activities such as getting mani-pedi with daughter.  Still having choking. Seems worse. Nexium ineffective. If laughing will regurgitate, choke and be unable to breathe for several seconds.   Can't sleep.   Depression screen [facility] 2/9 07/02/2021 05/28/2021 04/16/2021  Decreased Interest 2 2 1   Down, Depressed, Hopeless 2 1 1   PHQ - 2 Score 4 3 2   Altered sleeping 3 3 2   Tired, decreased energy 3 3 3   Change in appetite 1 2 2   Feeling bad or failure about yourself  2 1 1   Trouble concentrating 0 1 0  Moving slowly or fidgety/restless 0 1 0  Suicidal thoughts 1 0 0  PHQ-9 Score 14 14 10   Difficult doing work/chores Somewhat difficult Somewhat difficult Somewhat difficult    History Buddy has a past medical history of Anxiety, IBS (irritable bowel syndrome), and Recurrent kidney stones.   She has a past surgical history that includes Cholecystectomy (2006); Kidney stone surgery; and Colonoscopy w/ biopsies and polypectomy (06/2020).   Her family history includes Depression in her mother; Diabetes in her father; Heart disease in her father; Kidney disease in her father; Vision loss in her father.She reports that she has never smoked. She has never used smokeless tobacco. She reports current alcohol use of about 1.0 standard drink per week. She reports that she does not currently use drugs.    ROS Review of Systems  Constitutional: Negative.   HENT:  Positive for trouble swallowing.   Eyes:  Negative for visual disturbance.  Respiratory:  Negative for shortness of breath.   Cardiovascular:  Negative for chest pain.  Gastrointestinal:  Negative for abdominal pain.  Musculoskeletal:  Negative for arthralgias.  Skin:  Positive for  rash (left forearm).  Psychiatric/Behavioral:  Positive for sleep disturbance.    Objective:  BP 114/73    Pulse (!) 115    Temp 98.3 F (36.8 C)    Ht 5\' 4"  (1.626 m)    Wt 210 lb (95.3 kg)    SpO2 94%    BMI 36.05 kg/m   BP Readings from Last 3 Encounters:  07/02/21 114/73  05/28/21 103/65  04/16/21 113/71    Wt Readings from Last 3 Encounters:  07/02/21 210 lb (95.3 kg)  05/28/21 211 lb 3.2 oz (95.8 kg)  04/16/21 207 lb 2 oz (94 kg)     Physical Exam Constitutional:      General: She is not in acute distress.    Appearance: She is well-developed.  Cardiovascular:     Rate and Rhythm: Normal rate and regular rhythm.  Pulmonary:     Breath sounds: Normal breath sounds.  Musculoskeletal:        General: Normal range of motion.  Skin:    General: Skin is warm and dry.     Findings: Rash (maculopapular left forearm) present.  Neurological:     Mental Status: She is alert and oriented to person, place, and time.      Assessment & Plan:   Taeisha was seen today for follow-up.  Diagnoses and all orders for this visit:  Intrinsic eczema  Depression, major, single episode, moderate (HCC)  Gastroesophageal reflux disease with esophagitis without hemorrhage  Other orders -  esomeprazole (NEXIUM) 40 MG capsule; Take 1 capsule (40 mg total) by mouth 2 (two) times daily before a meal. -     mirtazapine (REMERON) 30 MG tablet; Take 1 tablet (30 mg total) by mouth at bedtime. -     fluocinonide cream (LIDEX) 0.05 %; Apply 1 application topically 2 (two) times daily.       I have discontinued Meredith Mody. Bowden's busPIRone and DULoxetine. I have also changed her esomeprazole. Additionally, I am having her start on mirtazapine and fluocinonide cream. Lastly, I am having her maintain her Cyanocobalamin (VITAMIN B-12 PO), FERROUS SULFATE PO, Cholecalciferol (VITAMIN D3 PO), CALCIUM PO, rOPINIRole, and fluticasone.  Allergies as of 07/02/2021   No Known Allergies       Medication List        Accurate as of July 02, 2021  2:49 PM. If you have any questions, ask your nurse or doctor.          STOP taking these medications    busPIRone 10 MG tablet Commonly known as: BUSPAR Stopped by: Claretta Fraise, MD   DULoxetine 30 MG capsule Commonly known as: Cymbalta Stopped by: Claretta Fraise, MD       TAKE these medications    CALCIUM PO Take by mouth.   esomeprazole 40 MG capsule Commonly known as: NexIUM Take 1 capsule (40 mg total) by mouth 2 (two) times daily before a meal. What changed: when to take this Changed by: Claretta Fraise, MD   FERROUS SULFATE PO Take by mouth.   fluocinonide cream 0.05 % Commonly known as: LIDEX Apply 1 application topically 2 (two) times daily. Started by: Claretta Fraise, MD   fluticasone 50 MCG/ACT nasal spray Commonly known as: FLONASE Place 2 sprays into both nostrils daily.   mirtazapine 30 MG tablet Commonly known as: Remeron Take 1 tablet (30 mg total) by mouth at bedtime. Started by: Claretta Fraise, MD   rOPINIRole 1 MG tablet Commonly known as: REQUIP Take 1 tablet (1 mg total) by mouth at bedtime.   VITAMIN B-12 PO Take by mouth.   VITAMIN D3 PO Take by mouth.         Follow-up: Return in about 1 month (around 07/30/2021).  Claretta Fraise, M.D.

## 2021-07-04 ENCOUNTER — Telehealth: Payer: Self-pay | Admitting: Family Medicine

## 2021-07-04 NOTE — Telephone Encounter (Signed)
It looks like you had started her on Mirtazapine 30mg  QHS and stopped the Buspar and Duloxetine. Pt is on Requip 1mg  for restless leg syndrome and had been doing well but she is saying the Mirtazapine has "reversed" the effectiveness of the Requip. Please advise.

## 2021-07-04 NOTE — Telephone Encounter (Signed)
Have her increase the ropinirole to 2 tabs at hs

## 2021-07-04 NOTE — Telephone Encounter (Signed)
Pt called stating that she recently had a visit with Dr Darlyn Read and started on some new medication.  Wanted to call and let Dr Darlyn Read know that the medication is not working for her. Says its even affecting her restless leg syndrome because since taking the new medicine, she has not been able to sleep in 2 days because her restless leg syndrome has been so bad.  Needs advise.

## 2021-07-05 NOTE — Telephone Encounter (Signed)
Left detailed voicemail

## 2021-08-01 DIAGNOSIS — R0789 Other chest pain: Secondary | ICD-10-CM | POA: Diagnosis not present

## 2021-08-01 DIAGNOSIS — R7989 Other specified abnormal findings of blood chemistry: Secondary | ICD-10-CM | POA: Diagnosis not present

## 2021-08-07 ENCOUNTER — Other Ambulatory Visit: Payer: Self-pay

## 2021-08-07 ENCOUNTER — Emergency Department (HOSPITAL_COMMUNITY): Payer: BC Managed Care – PPO

## 2021-08-07 ENCOUNTER — Observation Stay (HOSPITAL_COMMUNITY)
Admission: EM | Admit: 2021-08-07 | Discharge: 2021-08-09 | Disposition: A | Discharge status: Home / Self Care | Payer: BC Managed Care – PPO | Attending: Family Medicine | Admitting: Family Medicine

## 2021-08-07 ENCOUNTER — Encounter (HOSPITAL_COMMUNITY): Payer: Self-pay | Admitting: Emergency Medicine

## 2021-08-07 DIAGNOSIS — R109 Unspecified abdominal pain: Principal | ICD-10-CM | POA: Diagnosis present

## 2021-08-07 DIAGNOSIS — K589 Irritable bowel syndrome without diarrhea: Secondary | ICD-10-CM | POA: Diagnosis not present

## 2021-08-07 DIAGNOSIS — N200 Calculus of kidney: Secondary | ICD-10-CM | POA: Insufficient documentation

## 2021-08-07 DIAGNOSIS — R1032 Left lower quadrant pain: Secondary | ICD-10-CM | POA: Diagnosis not present

## 2021-08-07 DIAGNOSIS — R350 Frequency of micturition: Secondary | ICD-10-CM | POA: Diagnosis not present

## 2021-08-07 DIAGNOSIS — R1030 Lower abdominal pain, unspecified: Secondary | ICD-10-CM | POA: Diagnosis not present

## 2021-08-07 DIAGNOSIS — Z6835 Body mass index (BMI) 35.0-35.9, adult: Secondary | ICD-10-CM | POA: Diagnosis not present

## 2021-08-07 DIAGNOSIS — Z3202 Encounter for pregnancy test, result negative: Secondary | ICD-10-CM | POA: Diagnosis not present

## 2021-08-07 DIAGNOSIS — R1012 Left upper quadrant pain: Secondary | ICD-10-CM | POA: Diagnosis not present

## 2021-08-07 DIAGNOSIS — G2581 Restless legs syndrome: Secondary | ICD-10-CM | POA: Diagnosis present

## 2021-08-07 DIAGNOSIS — E6609 Other obesity due to excess calories: Secondary | ICD-10-CM | POA: Diagnosis not present

## 2021-08-07 DIAGNOSIS — Z87442 Personal history of urinary calculi: Secondary | ICD-10-CM | POA: Diagnosis not present

## 2021-08-07 LAB — COMPREHENSIVE METABOLIC PANEL
ALT: 26 U/L (ref 0–44)
AST: 20 U/L (ref 15–41)
Albumin: 3.8 g/dL (ref 3.5–5.0)
Alkaline Phosphatase: 79 U/L (ref 38–126)
Anion gap: 8 (ref 5–15)
BUN: 16 mg/dL (ref 6–20)
CO2: 24 mmol/L (ref 22–32)
Calcium: 9 mg/dL (ref 8.9–10.3)
Chloride: 106 mmol/L (ref 98–111)
Creatinine, Ser: 0.72 mg/dL (ref 0.44–1.00)
GFR, Estimated: >60 mL/min (ref >60)
Glucose, Bld: 118 mg/dL — ABNORMAL HIGH (ref 70–99)
Potassium: 3.7 mmol/L (ref 3.5–5.1)
Sodium: 138 mmol/L (ref 135–145)
Total Bilirubin: 0.5 mg/dL (ref 0.3–1.2)
Total Protein: 7 g/dL (ref 6.5–8.1)

## 2021-08-07 LAB — CBC WITH DIFFERENTIAL/PLATELET
Abs Immature Granulocytes: 0.06 10*3/uL (ref 0.00–0.07)
Basophils Absolute: 0 10*3/uL (ref 0.0–0.1)
Basophils Relative: 0 %
Eosinophils Absolute: 0.1 10*3/uL (ref 0.0–0.5)
Eosinophils Relative: 0 %
HCT: 35 % — ABNORMAL LOW (ref 36.0–46.0)
Hemoglobin: 11 g/dL — ABNORMAL LOW (ref 12.0–15.0)
Immature Granulocytes: 1 %
Lymphocytes Relative: 22 %
Lymphs Abs: 2.8 10*3/uL (ref 0.7–4.0)
MCH: 20.1 pg — ABNORMAL LOW (ref 26.0–34.0)
MCHC: 31.4 g/dL (ref 30.0–36.0)
MCV: 63.9 fL — ABNORMAL LOW (ref 80.0–100.0)
Monocytes Absolute: 0.7 10*3/uL (ref 0.1–1.0)
Monocytes Relative: 6 %
Neutro Abs: 9.5 10*3/uL — ABNORMAL HIGH (ref 1.7–7.7)
Neutrophils Relative %: 71 %
Platelets: 352 10*3/uL (ref 150–400)
RBC: 5.48 MIL/uL — ABNORMAL HIGH (ref 3.87–5.11)
RDW: 16.3 % — ABNORMAL HIGH (ref 11.5–15.5)
WBC: 13.2 10*3/uL — ABNORMAL HIGH (ref 4.0–10.5)
nRBC: 0 % (ref 0.0–0.2)

## 2021-08-07 LAB — URINALYSIS, ROUTINE W REFLEX MICROSCOPIC
Bilirubin Urine: NEGATIVE
Glucose, UA: NEGATIVE mg/dL
Ketones, ur: NEGATIVE mg/dL
Nitrite: NEGATIVE
Protein, ur: NEGATIVE mg/dL
Specific Gravity, Urine: 1.018 (ref 1.005–1.030)
pH: 5 (ref 5.0–8.0)

## 2021-08-07 LAB — I-STAT BETA HCG BLOOD, ED (MC, WL, AP ONLY): I-stat hCG, quantitative: <5 m[IU]/mL (ref <5)

## 2021-08-07 LAB — LIPASE, BLOOD: Lipase: 34 U/L (ref 11–51)

## 2021-08-07 MED ORDER — IOHEXOL 300 MG/ML  SOLN
100.0000 mL | Freq: Once | INTRAMUSCULAR | Status: AC | PRN
  Administered 2021-08-07: 100 mL via INTRAVENOUS

## 2021-08-07 MED ORDER — KETOROLAC TROMETHAMINE 15 MG/ML IJ SOLN
15.0000 mg | Freq: Once | INTRAMUSCULAR | Status: AC
Start: 2021-08-07 — End: 2021-08-08
  Administered 2021-08-08: 15 mg via INTRAVENOUS
  Filled 2021-08-07: qty 1

## 2021-08-07 MED ORDER — ONDANSETRON HCL 4 MG/2ML IJ SOLN
4.0000 mg | Freq: Once | INTRAMUSCULAR | Status: AC
  Administered 2021-08-08: 4 mg via INTRAVENOUS
  Filled 2021-08-07: qty 2

## 2021-08-07 MED ORDER — HYDROMORPHONE HCL 1 MG/ML IJ SOLN
0.5000 mg | Freq: Once | INTRAMUSCULAR | Status: AC
  Administered 2021-08-08: 0.5 mg via INTRAVENOUS
  Filled 2021-08-07: qty 1

## 2021-08-07 NOTE — ED Provider Triage Note (Signed)
Emergency Medicine Provider Triage Evaluation Note ? ?Amanda Clarke , a 41 y.o. female  was evaluated in triage.  Pt complains of abdominal pain. Patient states that she was having L lower chest pain about one week ago. She was seen by ED and had negative chest xray and sent home. She states that her pain has moved more towards the left lower quadrant and wraps around her back.  It has been worsening this week. She noticed that she has pain when having sex yesterday and had a small amount of blood after sex. Denies any vaginal discharge. She does endorse a fever at home as well. She has hx of kidney stones, but this is not like her normal stones. She went to urgent care today and found to have blood in urine. He sent her here for CT scan and further imaging.  ? ?Review of Systems  ?Positive: Abdominal pain, lower back pain, painful sex, postcoital bleeding ?Negative:  ? ?Physical Exam  ?BP 122/68 (BP Location: Left Arm)   Pulse 97   Temp 98 ?F (36.7 ?C) (Oral)   Resp 16   SpO2 97%  ?Gen:   Awake, no distress   ?Resp:  Normal effort  ?MSK:   Moves extremities without difficulty  ?Other:  LLQ tenderness, + left CVA tenderness. LUQ tenderness.  ? ?Medical Decision Making  ?Medically screening exam initiated at 9:47 PM.  Appropriate orders placed.  Amanda Clarke was informed that the remainder of the evaluation will be completed by another provider, this initial triage assessment does not replace that evaluation, and the importance of remaining in the ED until their evaluation is complete. ? ?Patient may also require pelvic exam once roomed due to dyspareunia.   ?  ?Claudie Leach, PA-C ?08/07/21 2152 ? ?

## 2021-08-07 NOTE — ED Provider Notes (Signed)
?[place] DEPT ?[facility] Emergency Department ?Provider Note ?MRN:  [id number]  ?Arrival date & time: 08/08/21    ? ?Chief Complaint   ?Abdominal Pain ?  ?History of Present Illness   ?Obie Scragg is a 41 y.o. year-old female with a history of IBS, kidney stones presenting to the ED with chief complaint of abdominal pain. ? ?Left upper quadrant abdominal pain for the past week.  At times it is located in the left lower quadrant as well.  Feels different from her kidney stone pain which she has experienced in the past.  Worse with palpation.  Sent here by PCP today.  Nausea but no vomiting, no bowel changes, has noted some hematuria. ? ?Review of Systems  ?A thorough review of systems was obtained and all systems are negative except as noted in the HPI and PMH.  ? ?Patient's Health History   ? ?Past Medical History:  ?Diagnosis Date  ? Anxiety   ? IBS (irritable bowel syndrome)   ? Recurrent kidney stones   ?  ?Past Surgical History:  ?Procedure Laterality Date  ? CHOLECYSTECTOMY  2006  ? COLONOSCOPY W/ BIOPSIES AND POLYPECTOMY  06/2020  ? needs to repeat in 5 years  ? KIDNEY STONE SURGERY    ?  ?Family History  ?Problem Relation Age of Onset  ? Depression Mother   ? Vision loss Father   ? Kidney disease Father   ? Heart disease Father   ? Diabetes Father   ?  ?Social History  ? ?Socioeconomic History  ? Marital status: Married  ?  Spouse name: Kersey Czyzewski  ? Number of children: 2  ? Years of education: 53  ? Highest education level: High school graduate  ?Occupational History  ? Not on file  ?Tobacco Use  ? Smoking status: Never  ? Smokeless tobacco: Never  ?Vaping Use  ? Vaping Use: Never used  ?Substance and Sexual Activity  ? Alcohol use: Yes  ?  Alcohol/week: 1.0 standard drink  ?  Types: 1 Glasses of wine per week  ? Drug use: Not Currently  ? Sexual activity: Yes  ?  Birth control/protection: None  ?Other Topics Concern  ? Not on file  ?Social History Narrative  ? Not on file  ? ?Social  Determinants of Health  ? ?Financial Resource Strain: Not on file  ?Food Insecurity: Not on file  ?Transportation Needs: Not on file  ?Physical Activity: Not on file  ?Stress: Not on file  ?Social Connections: Not on file  ?Intimate Partner Violence: Not on file  ?  ? ?Physical Exam  ? ?Vitals:  ? 08/08/21 0015 08/08/21 0130  ?BP: 129/66 102/65  ?Pulse: 92 (!) 104  ?Resp: 19 18  ?Temp:  98.3 ?F (36.8 ?C)  ?SpO2: 97% 96%  ?  ?CONSTITUTIONAL: Well-appearing, in moderate distress due to pain ?NEURO/PSYCH:  Alert and oriented x 3, no focal deficits ?EYES:  eyes equal and reactive ?ENT/NECK:  no LAD, no JVD ?CARDIO: Regular rate, well-perfused, normal S1 and S2 ?PULM:  CTAB no wheezing or rhonchi ?GI/GU:  non-distended, moderate tenderness to palpation to the left upper and lower quadrants ?MSK/SPINE:  No gross deformities, no edema ?SKIN:  no rash, atraumatic ? ? ?*Additional and/or pertinent findings included in MDM below ? ?Diagnostic and Interventional Summary  ? ? EKG Interpretation ? ?Date/Time:    ?Ventricular Rate:    ?PR Interval:    ?QRS Duration:   ?QT Interval:    ?QTC Calculation:   ?  R Axis:     ?Text Interpretation:   ?  ? ?  ? ?Labs Reviewed  ?CBC WITH DIFFERENTIAL/PLATELET - Abnormal; Notable for the following components:  ?    Result Value  ? WBC 13.2 (*)   ? RBC 5.48 (*)   ? Hemoglobin 11.0 (*)   ? HCT 35.0 (*)   ? MCV 63.9 (*)   ? MCH 20.1 (*)   ? RDW 16.3 (*)   ? Neutro Abs 9.5 (*)   ? All other components within normal limits  ?COMPREHENSIVE METABOLIC PANEL - Abnormal; Notable for the following components:  ? Glucose, Bld 118 (*)   ? All other components within normal limits  ?URINALYSIS, ROUTINE W REFLEX MICROSCOPIC - Abnormal; Notable for the following components:  ? APPearance CLOUDY (*)   ? Hgb urine dipstick LARGE (*)   ? Leukocytes,Ua SMALL (*)   ? Bacteria, UA FEW (*)   ? All other components within normal limits  ?URINE CULTURE  ?LIPASE, BLOOD  ?I-STAT BETA HCG BLOOD, ED ([facility], [facility], [facility] ONLY)  ?   ?CT Abdomen Pelvis W Contrast  ?Final Result  ?  ?US Pelvis Complete  ?Final Result  ?  ?US Transvaginal Non-OB  ?Final Result  ?  ?[place] Art/Ven Flow Abd Pelv Doppler  ?Final Result  ?  ?  ?Medications  ?HYDROmorphone (DILAUDID) injection 1 mg (has no administration in time range)  ?iohexol (OMNIPAQUE) 300 MG/ML solution 100 mL (100 mLs Intravenous Contrast Given 08/07/21 2322)  ?ondansetron [facility]) injection 4 mg (4 mg Intravenous Given 08/08/21 0012)  ?HYDROmorphone (DILAUDID) injection 0.5 mg (0.5 mg Intravenous Given 08/08/21 0013)  ?ketorolac (TORADOL) 15 MG/ML injection 15 mg (15 mg Intravenous Given 08/08/21 0013)  ?cefTRIAXone (ROCEPHIN) 2 g in sodium chloride 0.9 % 100 mL IVPB (0 g Intravenous Stopped 08/08/21 0141)  ?diphenhydrAMINE (BENADRYL) injection 25 mg (25 mg Intravenous Given 08/08/21 0059)  ?HYDROmorphone (DILAUDID) injection 1 mg (1 mg Intravenous Given 08/08/21 0146)  ?famotidine (PEPCID) IVPB 20 mg premix (0 mg Intravenous Stopped 08/08/21 0150)  ?  ? ?Procedures  /  Critical Care ?Procedures ? ?ED Course and Medical Decision Making  ?Initial Impression and Ddx ?Differential diagnosis includes kidney stone, splenic infarct, diverticulitis, gastritis, pancreatitis, pyelonephritis.  Reassuring vital signs, awaiting labs, CT imaging.  Ultrasound obtained in triage is without evidence of ovarian cyst or torsion. ? ?Past medical/surgical history that increases complexity of ED encounter: History of kidney stones ? ?Interpretation of Diagnostics ?I personally reviewed the laboratory assessment and my interpretation is as follows: No significant blood count or electrolyte disturbances noted on CBC/BMP.  hCG is negative.  Lipase normal.  Urinalysis with blood, leuk esterase positive, however multiple squamous cells and so question contaminated sample. ?   ?CT imaging is without acute abnormalities. ? ?Patient Reassessment and Ultimate Disposition/Management ?Continued pain despite multiple rounds of Dilaudid,  also tried Pepcid, Toradol.  Will admit to medicine for intractable pain.  Question pyelonephritis. ? ?Patient management required discussion with the following services or consulting groups:  Hospitalist Service ? ?Complexity of Problems Addressed ?Acute illness or injury that poses threat of life of bodily function ? ?Additional Data Reviewed and Analyzed ?Further history obtained from: ?Further history from spouse/family member ? ?Additional Factors Impacting ED Encounter Risk ?Consideration of hospitalization ? ?Barth Kirks. Sedonia Small, MD ?[facility] Emergency Medicine ?[place] [email or web address] ? ?Final Clinical Impressions(s) / ED Diagnoses  ? ?  ICD-10-CM   ?1. Flank pain  R10.9   ?  ?  ?  ED Discharge Orders   ? ? None  ? ?  ?  ? ?Discharge Instructions Discussed with and Provided to Patient:  ? ?Discharge Instructions   ?None ?  ? ?  ?Maudie Flakes, MD ?08/08/21 [phone] ? ?

## 2021-08-07 NOTE — ED Triage Notes (Signed)
Pt sent to ED from [facility] for further evaluation of LUQ abdominal pain. Pt states pain has been present for 1 week and increased in severity today. Endorses nausea, no active vomiting. Denies CP or SHOB.  ?

## 2021-08-08 ENCOUNTER — Encounter (HOSPITAL_COMMUNITY): Payer: Self-pay | Admitting: Internal Medicine

## 2021-08-08 DIAGNOSIS — F419 Anxiety disorder, unspecified: Secondary | ICD-10-CM | POA: Diagnosis not present

## 2021-08-08 DIAGNOSIS — R1012 Left upper quadrant pain: Secondary | ICD-10-CM | POA: Diagnosis not present

## 2021-08-08 DIAGNOSIS — R1032 Left lower quadrant pain: Secondary | ICD-10-CM | POA: Diagnosis not present

## 2021-08-08 DIAGNOSIS — F32A Depression, unspecified: Secondary | ICD-10-CM | POA: Diagnosis not present

## 2021-08-08 DIAGNOSIS — R109 Unspecified abdominal pain: Secondary | ICD-10-CM | POA: Diagnosis not present

## 2021-08-08 LAB — CBC WITH DIFFERENTIAL/PLATELET
Abs Immature Granulocytes: 0.07 10*3/uL (ref 0.00–0.07)
Basophils Absolute: 0 10*3/uL (ref 0.0–0.1)
Basophils Relative: 0 %
Eosinophils Absolute: 0.1 10*3/uL (ref 0.0–0.5)
Eosinophils Relative: 0 %
HCT: 32.3 % — ABNORMAL LOW (ref 36.0–46.0)
Hemoglobin: 10.3 g/dL — ABNORMAL LOW (ref 12.0–15.0)
Immature Granulocytes: 1 %
Lymphocytes Relative: 27 %
Lymphs Abs: 3.2 10*3/uL (ref 0.7–4.0)
MCH: 20.2 pg — ABNORMAL LOW (ref 26.0–34.0)
MCHC: 31.9 g/dL (ref 30.0–36.0)
MCV: 63.2 fL — ABNORMAL LOW (ref 80.0–100.0)
Monocytes Absolute: 1 10*3/uL (ref 0.1–1.0)
Monocytes Relative: 8 %
Neutro Abs: 7.6 10*3/uL (ref 1.7–7.7)
Neutrophils Relative %: 64 %
Platelets: 318 10*3/uL (ref 150–400)
RBC: 5.11 MIL/uL (ref 3.87–5.11)
RDW: 16.1 % — ABNORMAL HIGH (ref 11.5–15.5)
WBC: 12 10*3/uL — ABNORMAL HIGH (ref 4.0–10.5)
nRBC: 0 % (ref 0.0–0.2)

## 2021-08-08 LAB — COMPREHENSIVE METABOLIC PANEL
ALT: 23 U/L (ref 0–44)
AST: 16 U/L (ref 15–41)
Albumin: 3.4 g/dL — ABNORMAL LOW (ref 3.5–5.0)
Alkaline Phosphatase: 78 U/L (ref 38–126)
Anion gap: 10 (ref 5–15)
BUN: 13 mg/dL (ref 6–20)
CO2: 23 mmol/L (ref 22–32)
Calcium: 8.8 mg/dL — ABNORMAL LOW (ref 8.9–10.3)
Chloride: 106 mmol/L (ref 98–111)
Creatinine, Ser: 0.69 mg/dL (ref 0.44–1.00)
GFR, Estimated: >60 mL/min (ref >60)
Glucose, Bld: 120 mg/dL — ABNORMAL HIGH (ref 70–99)
Potassium: 3.7 mmol/L (ref 3.5–5.1)
Sodium: 139 mmol/L (ref 135–145)
Total Bilirubin: 0.5 mg/dL (ref 0.3–1.2)
Total Protein: 6.5 g/dL (ref 6.5–8.1)

## 2021-08-08 LAB — LIPASE, BLOOD: Lipase: 27 U/L (ref 11–51)

## 2021-08-08 LAB — LACTATE DEHYDROGENASE: LDH: 103 U/L (ref 98–192)

## 2021-08-08 LAB — MAGNESIUM: Magnesium: 1.9 mg/dL (ref 1.7–2.4)

## 2021-08-08 MED ORDER — HYDROMORPHONE HCL 1 MG/ML IJ SOLN
1.0000 mg | Freq: Once | INTRAMUSCULAR | Status: AC
  Administered 2021-08-08: 1 mg via INTRAVENOUS
  Filled 2021-08-08: qty 1

## 2021-08-08 MED ORDER — SODIUM CHLORIDE 0.9 % IV SOLN
2.0000 g | Freq: Once | INTRAVENOUS | Status: AC
Start: 2021-08-08 — End: 2021-08-08
  Administered 2021-08-08: 2 g via INTRAVENOUS
  Filled 2021-08-08: qty 20

## 2021-08-08 MED ORDER — LACTATED RINGERS IV SOLN: INTRAVENOUS | Status: DC

## 2021-08-08 MED ORDER — ACETAMINOPHEN 325 MG PO TABS: 650.0000 mg | ORAL_TABLET | Freq: Four times a day (QID) | ORAL | Status: DC | PRN

## 2021-08-08 MED ORDER — NALOXONE HCL 0.4 MG/ML IJ SOLN
0.4000 mg | INTRAMUSCULAR | Status: DC | PRN
Start: 2021-08-08 — End: 2021-08-08

## 2021-08-08 MED ORDER — BISACODYL 5 MG PO TBEC
5.0000 mg | DELAYED_RELEASE_TABLET | Freq: Every day | ORAL | Status: DC | PRN
  Filled 2021-08-08: qty 1

## 2021-08-08 MED ORDER — KETOROLAC TROMETHAMINE 30 MG/ML IJ SOLN
30.0000 mg | Freq: Four times a day (QID) | INTRAMUSCULAR | Status: DC | PRN
  Administered 2021-08-08 – 2021-08-09 (×2): 30 mg via INTRAVENOUS
  Filled 2021-08-08 (×2): qty 1

## 2021-08-08 MED ORDER — FAMOTIDINE IN NACL 20-0.9 MG/50ML-% IV SOLN
20.0000 mg | Freq: Once | INTRAVENOUS | Status: AC
  Administered 2021-08-08: 20 mg via INTRAVENOUS
  Filled 2021-08-08: qty 50

## 2021-08-08 MED ORDER — ENOXAPARIN SODIUM 40 MG/0.4ML IJ SOSY
40.0000 mg | PREFILLED_SYRINGE | INTRAMUSCULAR | Status: DC
  Administered 2021-08-08 – 2021-08-09 (×2): 40 mg via SUBCUTANEOUS
  Filled 2021-08-08 (×2): qty 0.4

## 2021-08-08 MED ORDER — SALINE SPRAY 0.65 % NA SOLN
1.0000 | NASAL | Status: DC | PRN
Start: 2021-08-08 — End: 2021-08-09
  Filled 2021-08-08: qty 44

## 2021-08-08 MED ORDER — POLYETHYLENE GLYCOL 3350 17 G PO PACK: 17.0000 g | PACK | Freq: Every day | ORAL | Status: DC | PRN

## 2021-08-08 MED ORDER — DIPHENHYDRAMINE HCL 50 MG/ML IJ SOLN
25.0000 mg | Freq: Once | INTRAMUSCULAR | Status: AC
  Administered 2021-08-08: 25 mg via INTRAVENOUS
  Filled 2021-08-08: qty 1

## 2021-08-08 MED ORDER — ONDANSETRON HCL 4 MG/2ML IJ SOLN: 4.0000 mg | Freq: Four times a day (QID) | INTRAMUSCULAR | Status: DC | PRN

## 2021-08-08 MED ORDER — ACETAMINOPHEN 650 MG RE SUPP: 650.0000 mg | Freq: Four times a day (QID) | RECTAL | Status: DC | PRN

## 2021-08-08 MED ORDER — FLUTICASONE PROPIONATE 50 MCG/ACT NA SUSP
2.0000 | Freq: Every day | NASAL | Status: DC
  Administered 2021-08-09: 2 via NASAL
  Filled 2021-08-08: qty 16

## 2021-08-08 MED ORDER — KETOROLAC TROMETHAMINE 15 MG/ML IJ SOLN: 15.0000 mg | Freq: Four times a day (QID) | INTRAMUSCULAR | Status: DC | PRN

## 2021-08-08 MED ORDER — ROPINIROLE HCL 0.5 MG PO TABS
1.0000 mg | ORAL_TABLET | Freq: Every day | ORAL | Status: DC
  Administered 2021-08-08: 1 mg via ORAL
  Filled 2021-08-08: qty 2
  Filled 2021-08-08: qty 1

## 2021-08-08 MED ORDER — HYDROMORPHONE HCL 1 MG/ML IJ SOLN: 0.5000 mg | INTRAMUSCULAR | Status: DC | PRN

## 2021-08-08 MED ORDER — HYDRALAZINE HCL 20 MG/ML IJ SOLN
5.0000 mg | INTRAMUSCULAR | Status: DC | PRN
Start: 2021-08-08 — End: 2021-08-09
  Filled 2021-08-08: qty 0.25

## 2021-08-08 MED ORDER — DIPHENHYDRAMINE HCL 25 MG PO CAPS
25.0000 mg | ORAL_CAPSULE | Freq: Four times a day (QID) | ORAL | Status: DC | PRN
  Administered 2021-08-08 (×2): 25 mg via ORAL
  Filled 2021-08-08 (×2): qty 1

## 2021-08-08 MED ORDER — DOCUSATE SODIUM 100 MG PO CAPS
100.0000 mg | ORAL_CAPSULE | Freq: Two times a day (BID) | ORAL | Status: DC
  Administered 2021-08-08 – 2021-08-09 (×3): 100 mg via ORAL
  Filled 2021-08-08 (×3): qty 1

## 2021-08-08 NOTE — Assessment & Plan Note (Signed)
-  Weight loss should be encouraged °-Outpatient PCP/bariatric medicine/bariatric surgery f/u encouraged °

## 2021-08-08 NOTE — Progress Notes (Signed)
?  Carryover admission to the Day Admitter.  I discussed this case with the EDP, Dr.Bero.  Per these discussions: ? ?This is a 41 year old female with history of kidney stones who is being admitted for overnight observation and for intractable abdominal discomfort after presenting with 1 week of progressive left-sided abdominal discomfort.  Initially, she noted her abdominal pain to be limited to the left upper quadrant, starting 1 week ago, but with ensuing increase in involved distribution, not reporting abdominal discomfort and acute left upper and left lower quadrant.  She notes increasing intensity over that timeframe, with pain subsequently becoming constant over the last 2 to 3 days.  She notes a prior history of kidney stones, but states that a abdominal pain with which she presents over the course of the last week, is different than that which she experienced at times of prior kidney stones, including from a quality, intensity, location, and exacerbation standpoint, will noting exacerbation of her presenting abdominal discomfort with palpation.  ? ?VS reportedly stable.  Urinalysis showed 6-10 white blood cells, but continued with 21-50 squamous epithelial cells, concerning for contaminated specimen. ? ?Abdominal ultrasound showed no evidence of ovarian pathology nor any evidence of ovarian torsion.  CT abdomen/pelvis reportedly showed no evidence of acute intra-abdominal process, including no evidence of pancreatitis or evidence of bowel obstruction, abscess, or perforation.  No evidence of kidney stones or hydronephrosis. ? ?Continues to report significant abdominal discomfort following 3 doses of IV Dilaudid as well as a dose of IV Toradol in the emergency department.  ? ?I have placed an order to admit for overnight observation for intractable left-sided abdominal discomfort.  ? ?I have placed some additional preliminary admit orders via the adult multi-morbid admission order set. I have also ordered prn  IV Dilaudid, as needed IV Toradol, prn Zofran. I have ordered LDH as well as repeat lipase. Additionally, have ordered fresh set of AM labs.  ? ? ? ?Newton Pigg, DO ?Hospitalist ? ?

## 2021-08-08 NOTE — Assessment & Plan Note (Signed)
Continue Requip. 

## 2021-08-08 NOTE — Plan of Care (Signed)

## 2021-08-08 NOTE — Consult Note (Signed)
Unassigned patient  ?Reason for Consult: Left lower quadrant pain. ?Referring Physician: [facility] hospitalist. ? ?Amanda Clarke is an 41 y.o. female.  ?HPI: Amanda Clarke is a 41 year old white female with multiple medical problems listed below presents to the ER with a 4-day history of severe unrelenting left lower quadrant pain radiating to her back and some left upper quadrant pain as well. She has had kidney stones "12 times before but it is not that this time".  On reviewing care everywhere she has had an EGD and a colonoscopy by Dr. Ellan Lambert but I was unable to review the results of the procedures.  She also had multiple visits to [facility] and [facility] hospitals for similar complaints of abdominal pain. On review of these records multiple CTs have been done in the past including recent CT on 06/14/2020. CT was completely normal as well.  Claims the Dilaudid that she is receiving is not helping her symptoms minimally. She gives a history of a fever 3 days ago. Her hemoglobin today is 10.3 g/dL with MCV of 61.4.  She has a thalassemia trait. She is on iron supplements ? ?Past Medical History:  ?Diagnosis Date  ? Anxiety   ? IBS (irritable bowel syndrome)   ? Recurrent kidney stones   ? ?Past Surgical History:  ?Procedure Laterality Date  ? CHOLECYSTECTOMY  2006  ? COLONOSCOPY W/ BIOPSIES AND POLYPECTOMY  06/2020  ? needs to repeat in 5 years  ? KIDNEY STONE SURGERY    ? ?Family History  ?Problem Relation Age of Onset  ? Depression Mother   ? Vision loss Father   ? Kidney disease Father   ? Heart disease Father   ? Diabetes Father   ? ?Social History:  reports that she has never smoked. She has never used smokeless tobacco. She reports current alcohol use of about 1.0 standard drink per week. She reports that she does not currently use drugs. ? ?Allergies:  ?Allergies  ?Allergen Reactions  ? Dilaudid [Hydromorphone] Itching  ? ?Medications: I have reviewed the patient's current medications. ?Prior to  Admission:  ?Medications Prior to Admission  ?Medication Sig Dispense Refill Last Dose  ? CALCIUM PO Take by mouth.   Past Week  ? Cholecalciferol (VITAMIN D3 PO) Take by mouth.   Past Week  ? Cyanocobalamin (VITAMIN B-12 PO) Take by mouth.   Past Week  ? FERROUS SULFATE PO Take by mouth.   Past Week  ? fluocinonide cream (LIDEX) 0.05 % Apply 1 application topically 2 (two) times daily. 30 g 5 Past Month  ? fluticasone (FLONASE) 50 MCG/ACT nasal spray Place 2 sprays into both nostrils daily. 16 g 6 Past Month  ? rOPINIRole (REQUIP) 1 MG tablet Take 1 tablet (1 mg total) by mouth at bedtime. 90 tablet 1 Past Week  ? ?Scheduled: ? docusate sodium  100 mg Oral BID  ? enoxaparin (LOVENOX) injection  40 mg Subcutaneous Q24H  ? fluticasone  2 spray Each Nare Daily  ? rOPINIRole  1 mg Oral QHS  ? ?Continuous: ? lactated ringers Stopped (08/08/21 1825)  ? ? ?Results for orders placed or performed during the hospital encounter of 08/07/21 (from the past 48 hour(s))  ?CBC with Differential     Status: Abnormal  ? Collection Time: 08/07/21  9:52 PM  ?Result Value Ref Range  ? WBC 13.2 (H) 4.0 - 10.5 K/uL  ? RBC 5.48 (H) 3.87 - 5.11 MIL/uL  ? Hemoglobin 11.0 (L) 12.0 - 15.0 g/dL  ?  HCT 35.0 (L) 36.0 - 46.0 %  ? MCV 63.9 (L) 80.0 - 100.0 fL  ? MCH 20.1 (L) 26.0 - 34.0 pg  ? MCHC 31.4 30.0 - 36.0 g/dL  ? RDW 16.3 (H) 11.5 - 15.5 %  ? Platelets 352 150 - 400 K/uL  ? nRBC 0.0 0.0 - 0.2 %  ? Neutrophils Relative % 71 %  ? Neutro Abs 9.5 (H) 1.7 - 7.7 K/uL  ? Lymphocytes Relative 22 %  ? Lymphs Abs 2.8 0.7 - 4.0 K/uL  ? Monocytes Relative 6 %  ? Monocytes Absolute 0.7 0.1 - 1.0 K/uL  ? Eosinophils Relative 0 %  ? Eosinophils Absolute 0.1 0.0 - 0.5 K/uL  ? Basophils Relative 0 %  ? Basophils Absolute 0.0 0.0 - 0.1 K/uL  ? RBC Morphology TEARDROP CELLS   ? Immature Granulocytes 1 %  ? Abs Immature Granulocytes 0.06 0.00 - 0.07 K/uL  ?  Comment: Performed at [facility], [address], [place], [place] [address]  ?Comprehensive  metabolic panel     Status: Abnormal  ? Collection Time: 08/07/21  9:52 PM  ?Result Value Ref Range  ? Sodium 138 135 - 145 mmol/L  ? Potassium 3.7 3.5 - 5.1 mmol/L  ? Chloride 106 98 - 111 mmol/L  ? CO2 24 22 - 32 mmol/L  ? Glucose, Bld 118 (H) 70 - 99 mg/dL  ?  Comment: Glucose reference range applies only to samples taken after fasting for at least 8 hours.  ? BUN 16 6 - 20 mg/dL  ? Creatinine, Ser 0.72 0.44 - 1.00 mg/dL  ? Calcium 9.0 8.9 - 10.3 mg/dL  ? Total Protein 7.0 6.5 - 8.1 g/dL  ? Albumin 3.8 3.5 - 5.0 g/dL  ? AST 20 15 - 41 U/L  ? ALT 26 0 - 44 U/L  ? Alkaline Phosphatase 79 38 - 126 U/L  ? Total Bilirubin 0.5 0.3 - 1.2 mg/dL  ? GFR, Estimated >60 >60 mL/min  ?  Comment: (NOTE) ?Calculated using the CKD-EPI Creatinine Equation (2021) ?  ? Anion gap 8 5 - 15  ?  Comment: Performed at [facility], [address] [place], [place] [address]  ?Lipase, blood     Status: None  ? Collection Time: 08/07/21  9:52 PM  ?Result Value Ref Range  ? Lipase 34 11 - 51 U/L  ?  Comment: Performed at [facility], [address] [place], [place] [address]  ?POC beta hCG blood, ED     Status: None  ? Collection Time: 08/07/21 10:05 PM  ?Result Value Ref Range  ? I-stat hCG, quantitative <5.0 <5 mIU/mL  ? Comment 3          ?  Comment:   GEST. AGE      CONC.  (mIU/mL) ?  <=1 WEEK        5 - 50 ?    2 WEEKS       50 - 500 ?    3 WEEKS       100 - 10,000 ?    4 WEEKS     1,000 - 30,000 ?       ?FEMALE AND NON-PREGNANT FEMALE: ?    LESS THAN 5 mIU/mL ?  ?Urinalysis, Routine w reflex microscopic Urine, Clean Catch     Status: Abnormal  ? Collection Time: 08/07/21 10:06 PM  ?Result Value Ref Range  ? Color, Urine YELLOW YELLOW  ?  APPearance CLOUDY (A) CLEAR  ? Specific Gravity, Urine 1.018 1.005 - 1.030  ? pH 5.0 5.0 - 8.0  ? Glucose, UA NEGATIVE NEGATIVE mg/dL  ? Hgb urine dipstick LARGE (A) NEGATIVE  ? Bilirubin Urine NEGATIVE NEGATIVE  ? Ketones, ur NEGATIVE NEGATIVE mg/dL  ? Protein, ur NEGATIVE NEGATIVE mg/dL   ? Nitrite NEGATIVE NEGATIVE  ? Leukocytes,Ua SMALL (A) NEGATIVE  ? RBC / HPF 0-5 0 - 5 RBC/hpf  ? WBC, UA 6-10 0 - 5 WBC/hpf  ? Bacteria, UA FEW (A) NONE SEEN  ? Squamous Epithelial / LPF 21-50 0 - 5  ? Ca Oxalate Crys, UA PRESENT   ?  Comment: Performed at [facility], [address] [place], [place] [address]  ?Lactate dehydrogenase     Status: None  ? Collection Time: 08/08/21  5:00 AM  ?Result Value Ref Range  ? LDH 103 98 - 192 U/L  ?  Comment: Performed at [facility], [address] [place], [place] [address]  ?Magnesium     Status: None  ? Collection Time: 08/08/21  5:00 AM  ?Result Value Ref Range  ? Magnesium 1.9 1.7 - 2.4 mg/dL  ?  Comment: Performed at [facility], [address] [place], [place] [address]  ?Comprehensive metabolic panel     Status: Abnormal  ? Collection Time: 08/08/21  5:00 AM  ?Result Value Ref Range  ? Sodium 139 135 - 145 mmol/L  ? Potassium 3.7 3.5 - 5.1 mmol/L  ? Chloride 106 98 - 111 mmol/L  ? CO2 23 22 - 32 mmol/L  ? Glucose, Bld 120 (H) 70 - 99 mg/dL  ?  Comment: Glucose reference range applies only to samples taken after fasting for at least 8 hours.  ? BUN 13 6 - 20 mg/dL  ? Creatinine, Ser 0.69 0.44 - 1.00 mg/dL  ? Calcium 8.8 (L) 8.9 - 10.3 mg/dL  ? Total Protein 6.5 6.5 - 8.1 g/dL  ? Albumin 3.4 (L) 3.5 - 5.0 g/dL  ? AST 16 15 - 41 U/L  ? ALT 23 0 - 44 U/L  ? Alkaline Phosphatase 78 38 - 126 U/L  ? Total Bilirubin 0.5 0.3 - 1.2 mg/dL  ? GFR, Estimated >60 >60 mL/min  ?  Comment: (NOTE) ?Calculated using the CKD-EPI Creatinine Equation (2021) ?  ? Anion gap 10 5 - 15  ?  Comment: Performed at [facility], [address] [place], [place] [address]  ?CBC with Differential/Platelet     Status: Abnormal  ? Collection Time: 08/08/21  5:00 AM  ?Result Value Ref Range  ? WBC 12.0 (H) 4.0 - 10.5 K/uL  ? RBC 5.11 3.87 - 5.11 MIL/uL  ? Hemoglobin 10.3 (L) 12.0 - 15.0 g/dL  ? HCT 32.3 (L) 36.0 - 46.0 %  ? MCV 63.2 (L) 80.0 - 100.0 fL  ? MCH 20.2 (L)  26.0 - 34.0 pg  ? MCHC 31.9 30.0 - 36.0 g/dL  ? RDW 16.1 (H) 11.5 - 15.5 %  ? Platelets 318 150 - 400 K/uL  ? nRBC 0.0 0.0 - 0.2 %  ? Neutrophils Relative % 64 %  ? Neutro Abs 7.6 1.7 - 7.7 K/uL  ? Lymphocytes

## 2021-08-08 NOTE — Assessment & Plan Note (Addendum)
-  Patient with h/o mood d/o not on medications and IBS presenting with 1 week of abdominal pain in 2 discrete locations ?-Mild nausea, no vomiting, normal BMs ?-Unremarkable labs ?-Imaging reassuring ?-Patient would be appropriate for dc; however, she does not believe she can be successful at home ?-Will observe overnight on med surg ?-GI consult - Dr. Loreta Ave to see ?-I specifically explained about how opiates can worsen abdominal pain by causing constipation and explained that she will not be provided opiates for pain control ?-Toradol ordered ?-Clear liquids, advance as tolerated to regular diet ?-Anticipate dc to home tomorrow ?

## 2021-08-08 NOTE — H&P (Signed)
?History and Physical  ? ? ?Patient: Amanda Clarke ZOX:[id number] DOB: 06/16/1980 ?DOA: 08/07/2021 ?DOS: the patient was seen and examined on 08/08/2021 ?PCP: Gabriel EaringMorgan, Tiffany M, FNP  ?Patient coming from: Home - lives with husband and 5 children; NOK: Carolan ShiverHusband, Del Nestler, [phone] ? ? ?Chief Complaint: Flank pain ? ?HPI: Amanda MileKimberly W. Cantara is a 41 y.o. female with medical history significant of anxiety, IBS, and recurrent nephrolithiasis presenting with flank pain. She reports 1 week of LLQ and midepigastric abdominal pain.  +nausea, no vomiting, normal BMs.  This pain is intractable and she does not feel that she can be discharged safely.  She also has little appetite and feels like she will be unable to successfully hydrate at home. She has colon[place] in [place] and reports that she cannot remember what led to those studies. ? ? ? ?ER Course:  Carryover, per Dr. Arlean HoppingHowerter: ? ?Intractable abdominal discomfort after presenting with 1 week of progressive left-sided abdominal discomfort.  Initially, she noted her abdominal pain to be limited to the left upper quadrant, starting 1 week ago, but with ensuing increase in involved distribution, not reporting abdominal discomfort and acute left upper and left lower quadrant.  She notes increasing intensity over that timeframe, with pain subsequently becoming constant over the last 2 to 3 days.  She notes a prior history of kidney stones, but states that a abdominal pain with which she presents over the course of the last week, is different than that which she experienced at times of prior kidney stones, including from a quality, intensity, location, and exacerbation standpoint, will noting exacerbation of her presenting abdominal discomfort with palpation.  ?  ?VS reportedly stable.  Urinalysis showed 6-10 white blood cells, but continued with 21-50 squamous epithelial cells, concerning for contaminated specimen. ?  ?Abdominal ultrasound showed no  evidence of ovarian pathology nor any evidence of ovarian torsion.  CT abdomen/pelvis reportedly showed no evidence of acute intra-abdominal process, including no evidence of pancreatitis or evidence of bowel obstruction, abscess, or perforation.  No evidence of kidney stones or hydronephrosis. ?  ?Continues to report significant abdominal discomfort following 3 doses of IV Dilaudid as well as a dose of IV Toradol in the emergency department.  ? ? ? ? ?Review of Systems: As mentioned in the history of present illness. All other systems reviewed and are negative. ?Past Medical History:  ?Diagnosis Date  ? Anxiety   ? IBS (irritable bowel syndrome)   ? Recurrent kidney stones   ? ?Past Surgical History:  ?Procedure Laterality Date  ? CHOLECYSTECTOMY  2006  ? COLONOSCOPY W/ BIOPSIES AND POLYPECTOMY  06/2020  ? needs to repeat in 5 years  ? KIDNEY STONE SURGERY    ? ?Social History:  reports that she has never smoked. She has never used smokeless tobacco. She reports current alcohol use of about 1.0 standard drink per week. She reports that she does not currently use drugs. ? ?Allergies  ?Allergen Reactions  ? Dilaudid [Hydromorphone] Itching  ? ? ?Family History  ?Problem Relation Age of Onset  ? Depression Mother   ? Vision loss Father   ? Kidney disease Father   ? Heart disease Father   ? Diabetes Father   ? ? ?Prior to Admission medications   ?Medication Sig Start Date End Date Taking? Authorizing Provider  ?CALCIUM PO Take by mouth.   Yes [provider]  ?Cholecalciferol (VITAMIN D3 PO) Take by mouth.   Yes [provider]  ?  Cyanocobalamin (VITAMIN B-12 PO) Take by mouth.   Yes [provider]  ?FERROUS SULFATE PO Take by mouth.   Yes [provider]  ?fluocinonide cream (LIDEX) 0.05 % Apply 1 application topically 2 (two) times daily. 07/02/21  Yes Mechele Claude, MD  ?fluticasone (FLONASE) 50 MCG/ACT nasal spray Place 2 sprays into both nostrils daily. 06/04/21  Yes Daphine Deutscher,  Mary-Margaret, FNP  ?rOPINIRole (REQUIP) 1 MG tablet Take 1 tablet (1 mg total) by mouth at bedtime. 04/27/21  Yes Gabriel Earing, FNP  ?esomeprazole (NEXIUM) 40 MG capsule Take 1 capsule (40 mg total) by mouth 2 (two) times daily before a meal. ?Patient not taking: Reported on 08/08/2021 07/02/21   Mechele Claude, MD  ?mirtazapine (REMERON) 30 MG tablet Take 1 tablet (30 mg total) by mouth at bedtime. ?Patient not taking: Reported on 08/08/2021 07/02/21   Mechele Claude, MD  ? ? ?Physical Exam: ?Vitals:  ? 08/08/21 1100 08/08/21 1115 08/08/21 1145 08/08/21 1200  ?BP: 113/70 108/73 107/75 (!) 105/59  ?Pulse: 77 79 86 79  ?Resp: 13 18 12 16   ?Temp:      ?TempSrc:      ?SpO2: 99% 98% 99% 98%  ? ?General:  Appears calm and comfortable and is in NAD ?Eyes:  EOMI, normal lids, iris ?ENT:  grossly normal hearing, lips & tongue, mmm ?Neck:  no LAD, masses or thyromegaly ?Cardiovascular:  RRR, no m/r/g. No LE edema.  ?Respiratory:   CTA bilaterally with no wheezes/rales/rhonchi.  Normal respiratory effort. ?Abdomen:  soft, mildly TTP in LLQ and mid-epigastric regions, ND ?Skin:  no rash or induration seen on limited exam ?Musculoskeletal:  grossly normal tone BUE/BLE, good ROM, no bony abnormality ?Psychiatric:  grossly normal mood and affect, speech fluent and appropriate, AOx3 ?Neurologic:  CN 2-12 grossly intact, moves all extremities in coordinated fashion ? ? ?Radiological Exams on Admission: ?Independently reviewed - see discussion in A/P where applicable ? ? Transvaginal Non-OB ? ?Result Date: 08/07/2021 ?CLINICAL DATA:  Left lower quadrant pain. EXAM: TRANSABDOMINAL AND TRANSVAGINAL ULTRASOUND OF PELVIS DOPPLER ULTRASOUND OF OVARIES TECHNIQUE: Both transabdominal and transvaginal ultrasound examinations of the pelvis were performed. Transabdominal technique was performed for global imaging of the pelvis including uterus, ovaries, adnexal regions, and pelvic cul-de-sac. It was necessary to proceed with endovaginal  exam following the transabdominal exam to visualize the bilateral ovaries. Color and duplex Doppler ultrasound was utilized to evaluate blood flow to the ovaries. COMPARISON:  None. FINDINGS: Uterus Measurements: 8.8 x 4.7 x 5.4 cm = volume: 117 mL. No fibroids or other mass visualized. Endometrium Thickness: 7 mm.  No focal abnormality visualized. Right ovary Measurements: 2.6 x 1.5 x 3.4 cm = volume: 7 mL. Normal appearance/no adnexal mass. Left ovary Measurements: 2.4 x 2.1 x 2.1 cm = volume: 5 mL. Normal appearance/no adnexal mass. Pulsed Doppler evaluation demonstrates normal low-resistance arterial and venous waveforms in both ovaries. Other: Likely trace free fluid within the left adnexa. IMPRESSION: 1. Likely trace free fluid within the left adnexa. 2. Otherwise unremarkable pelvic ultrasound. Electronically Signed   By: 08/09/2021 M.D.   On: 08/07/2021 23:00  ? ?08/09/2021 Pelvis Complete ? ?Result Date: 08/07/2021 ?CLINICAL DATA:  Left lower quadrant pain. EXAM: TRANSABDOMINAL AND TRANSVAGINAL ULTRASOUND OF PELVIS DOPPLER ULTRASOUND OF OVARIES TECHNIQUE: Both transabdominal and transvaginal ultrasound examinations of the pelvis were performed. Transabdominal technique was performed for global imaging of the pelvis including uterus, ovaries, adnexal regions, and pelvic cul-de-sac. It was necessary to proceed with endovaginal exam following  the transabdominal exam to visualize the bilateral ovaries. Color and duplex Doppler ultrasound was utilized to evaluate blood flow to the ovaries. COMPARISON:  None. FINDINGS: Uterus Measurements: 8.8 x 4.7 x 5.4 cm = volume: 117 mL. No fibroids or other mass visualized. Endometrium Thickness: 7 mm.  No focal abnormality visualized. Right ovary Measurements: 2.6 x 1.5 x 3.4 cm = volume: 7 mL. Normal appearance/no adnexal mass. Left ovary Measurements: 2.4 x 2.1 x 2.1 cm = volume: 5 mL. Normal appearance/no adnexal mass. Pulsed Doppler evaluation demonstrates normal  low-resistance arterial and venous waveforms in both ovaries. Other: Likely trace free fluid within the left adnexa. IMPRESSION: 1. Likely trace free fluid within the left adnexa. 2. Otherwise unremarkable pelvic ultra

## 2021-08-09 DIAGNOSIS — R109 Unspecified abdominal pain: Secondary | ICD-10-CM | POA: Diagnosis not present

## 2021-08-09 LAB — CBC
HCT: 33.1 % — ABNORMAL LOW (ref 36.0–46.0)
Hemoglobin: 10.2 g/dL — ABNORMAL LOW (ref 12.0–15.0)
MCH: 19.7 pg — ABNORMAL LOW (ref 26.0–34.0)
MCHC: 30.8 g/dL (ref 30.0–36.0)
MCV: 64 fL — ABNORMAL LOW (ref 80.0–100.0)
Platelets: 306 10*3/uL (ref 150–400)
RBC: 5.17 MIL/uL — ABNORMAL HIGH (ref 3.87–5.11)
RDW: 16.1 % — ABNORMAL HIGH (ref 11.5–15.5)
WBC: 8.1 10*3/uL (ref 4.0–10.5)
nRBC: 0 % (ref 0.0–0.2)

## 2021-08-09 LAB — BASIC METABOLIC PANEL
Anion gap: 6 (ref 5–15)
BUN: 10 mg/dL (ref 6–20)
CO2: 26 mmol/L (ref 22–32)
Calcium: 8.7 mg/dL — ABNORMAL LOW (ref 8.9–10.3)
Chloride: 107 mmol/L (ref 98–111)
Creatinine, Ser: 0.66 mg/dL (ref 0.44–1.00)
GFR, Estimated: >60 mL/min (ref >60)
Glucose, Bld: 98 mg/dL (ref 70–99)
Potassium: 3.8 mmol/L (ref 3.5–5.1)
Sodium: 139 mmol/L (ref 135–145)

## 2021-08-09 LAB — URINE CULTURE

## 2021-08-09 MED ORDER — ACETAMINOPHEN 325 MG PO TABS: 650.0000 mg | ORAL_TABLET | Freq: Four times a day (QID) | ORAL | Status: DC | PRN

## 2021-08-09 NOTE — Progress Notes (Signed)
Discharge instructions reviewed with patient, patient verbalizes understanding. Doctors note provided to patient by provider. Ivs removed, future appointments reviewed. Patient discharged  ?

## 2021-08-09 NOTE — Progress Notes (Signed)
Initial Nutrition Assessment ? ?DOCUMENTATION CODES:  ? ?Obesity unspecified ? ?INTERVENTION:  ?Boost Breeze po BID, each supplement provides 250 kcal and 9 grams of protein ? ?Encourage adequate PO intake  ? ? ?NUTRITION DIAGNOSIS:  ? ?Inadequate oral intake related to decreased appetite (x 1 week PTA due to abdominal pain) as evidenced by per patient/family report. ? ? ?GOAL:  ? ?Patient will meet greater than or equal to 90% of their needs ? ? ?MONITOR:  ? ?Supplement acceptance, PO intake, Weight trends, Labs ? ?REASON FOR ASSESSMENT:  ? ?Consult ?Assessment of nutrition requirement/status (nutritional goals) ? ?ASSESSMENT:  ? ?Patient is a 41 year old female who presented with left upper quadrant and midepigastric abdominal pain, denies nausea and vomiting, normal BM's and was admitted for further evaluation and management of intractable abdominal pain. Past medical history includes IBS, recurrent kidney stones, anxiety.   ? ?Patient diet advanced to regular this morning. Plan is to see how well she tolerates diet before discharge.  ? ?Per chart review, pt reports that she feels like she will be unable to successfully hydrate at home.  ? ?Met with pt at bedside. Per pt, pt endorses a good appetite today and states that she is hungry. Pt denies any nausea or vomiting and reports some mild midepigastric abdominal pain. Pt reports that she hasn't had anything to eat today and was told that her diet would be advanced to regular to see how she tolerates it. Prior to admission, pt reports that she has a good appetite at baseline and typically eats three meals a day. For breakfast, pt reports that she typically has an [employer] protein shake, bacon, eggs. For lunch, pt reports that she has a salad and that she prepares dinner for her family which usually consists of a meat, vegetables and a starch. Pt reports that about one month ago, she completely cut out sodas and only drank water. She reports that this was going  well up until the last week when her abdominal pain started and reports that she didn't drink enough fluids and had decreased appetite. Pt reports that her usual body weight is 214# and that she has been trying to lose weight.  ? ?Discussed with pt the importance of adequate PO intake for healing and to meet nutritional needs. Discussed the importance of hydrating adequately and provided tips on increasing water intake. Discussed oral nutrition supplementation with pt and pt wanting to try Boost Breeze so dietetic intern provided one for pt to try during visit. Planning for discharge today, but will provide Boost Breeze BID for pt, per pt request. No other nutritional interventions needed at this time. Please re-consult RD if other nutrition concerns arise.  ? ?Reviewed weight history:  ?Current weight: not documented  ?07/02/21: 95.3 kg  ?05/28/21: 95.8 kg  ? ?Labs reviewed.  ? ?Medications reviewed and include: ? docusate sodium  100 mg Oral BID  ? ?NUTRITION - FOCUSED PHYSICAL EXAM: ? ?Flowsheet Row Most Recent Value  ?Orbital Region No depletion  ?Upper Arm Region No depletion  ?Thoracic and Lumbar Region No depletion  ?Buccal Region No depletion  ?Temple Region No depletion  ?Clavicle Bone Region No depletion  ?Clavicle and Acromion Bone Region No depletion  ?Scapular Bone Region No depletion  ?Dorsal Hand No depletion  ?Patellar Region No depletion  ?Anterior Thigh Region No depletion  ?Posterior Calf Region No depletion  ?Edema (RD Assessment) None  ?Hair Reviewed  ?Eyes Reviewed  ?Mouth Reviewed  ?Skin Reviewed  ?  Nails Reviewed  ? ?  ? ? ?Diet Order:   ?Diet Order   ? ?       ?  Diet regular Room service appropriate? Yes; Fluid consistency: Thin  Diet effective now       ?  ?  Diet - low sodium heart healthy       ?  ? ?  ?  ? ?  ? ? ?EDUCATION NEEDS:  ? ?Education needs have been addressed ? ?Skin:  Skin Assessment: Reviewed RN Assessment ? ?Last BM:  3/29 ? ?Height:  ? ?Ht Readings from Last 1 Encounters:   ?07/02/21 '5\' 4"'  (1.626 m)  ? ? ?Weight: 97.5 kg  ? ? ?Ideal Body Weight:  54.5 kg ? ?BMI:  Body mass index is 36.9 kg/m?. ? ?Estimated Nutritional Needs:  ? ?Kcal:  2350 - 2550 ? ?Protein:  115 - 130 grams ? ?Fluid:  >/= 2.3 L ? ? ? ?Maryruth Hancock, Dietetic Intern ?08/09/2021 10:38 AM ?

## 2021-08-09 NOTE — Progress Notes (Signed)
MD notified due to patients increase abdominal pain. She states that the toradol does not help her pain like the dilaudid did. MD did not feel like she would benefit from dilaudid due to the increase risk of constipation. Patient is having normal bowel movements. Heat packs provided for patient. ? ?Patient also reported that her pain does increase after she voids.  ?Will continue to monitor.  ?

## 2021-08-09 NOTE — Discharge Summary (Signed)
Physician Discharge Summary  ?Amanda Clarke ZOX:[id number] DOB: 10/24/1980 DOA: 08/07/2021 ? ?PCP: Gabriel EaringMorgan, Tiffany M, FNP ? ?Admit date: 08/07/2021 ?Discharge date: 08/09/2021 ? ?Time spent: 40 minutes ? ?Recommendations for Outpatient Follow-up:  ?Follow-up with primary care physician in 1 to 2 weeks ?[facility] 2 to 4 weeks ? ?Discharge Diagnoses:  ?Principal Problem: ?  Intractable abdominal pain ?Active Problems: ?  Restless leg syndrome ?  Class 2 obesity due to excess calories without serious comorbidity with body mass index (BMI) of 35.0 to 35.9 in adult ? ? ?Discharge Condition: Stable ? ?Diet recommendation: Regular ? ?Filed Weights  ? 08/09/21 1029  ?Weight: 97.5 kg  ? ? ?History of present illness:  ?HPI: Amanda MileKimberly W. Kruer is a 41 y.o. female with medical history significant of anxiety, IBS, and recurrent nephrolithiasis presenting with flank pain. She reports 1 week of LLQ and midepigastric abdominal pain.  +nausea, no vomiting, normal BMs.  This pain is intractable and she does not feel that she can be discharged safely.  She also has little appetite and feels like she will be unable to successfully hydrate at home. [place] and reports that she cannot remember what led to those studies. ?  ?  ?  ?ER Course:  Carryover, per Dr. Arlean HoppingHowerter: ?  ?Intractable abdominal discomfort after presenting with 1 week of progressive left-sided abdominal discomfort.  Initially, she noted her abdominal pain to be limited to the left upper quadrant, starting 1 week ago, but with ensuing increase in involved distribution, not reporting abdominal discomfort and acute left upper and left lower quadrant.  She notes increasing intensity over that timeframe, with pain subsequently becoming constant over the last 2 to 3 days.  She notes a prior history of kidney stones, but states that a abdominal pain with which she presents over the course of the last week,  is different than that which she experienced at times of prior kidney stones, including from a quality, intensity, location, and exacerbation standpoint, will noting exacerbation of her presenting abdominal discomfort with palpation.  ?  ?VS reportedly stable.  Urinalysis showed 6-10 white blood cells, but continued with 21-50 squamous epithelial cells, concerning for contaminated specimen. ?  ?Abdominal ultrasound showed no evidence of ovarian pathology nor any evidence of ovarian torsion.  CT abdomen/pelvis reportedly showed no evidence of acute intra-abdominal process, including no evidence of pancreatitis or evidence of bowel obstruction, abscess, or perforation.  No evidence of kidney stones or hydronephrosis. ?  ?Continues to report significant abdominal discomfort following 3 doses of IV Dilaudid as well as a dose of IV Toradol in the emergency department.  ?  ?  ? ?Hospital Course:  ?Patient was observed overnight.  GI was consulted who did not have any further recommendations.  Patient was tolerating regular diet by the time of discharge and ambulating well.  She had no fevers.  Patient has had this issue for over 2 years has not seen [facility] doc [facility] for over a year because of COVID.  Patient instructed to follow-up with [facility] or if she wishes she can switch to GI [facility] for further work-up.  Patient is being discharged in stable and improved condition with unclear etiology of her pain and no acute source identified. ? ? ?Discharge Exam: ?Vitals:  ? 08/09/21 0456 08/09/21 0819  ?BP: (!) 101/55 118/89  ?Pulse: 80 98  ?Resp:  16 18  ?Temp:  97.9 ?F (36.6 ?C)  ?SpO2: 95% 96%  ? ? ?General: Alert and oriented no apparent distress ?Cardiovascular: Regular rate and rhythm without murmurs rubs or gallops ?Respiratory: Clear to auscultation bilaterally no wheezes rhonchi rales ? ?Discharge Instructions ? ? ? ?Allergies as of 08/09/2021   ? ?   Reactions  ? Dilaudid [hydromorphone] Itching   ? ?  ? ?  ?Medication List  ?  ? ?TAKE these medications   ? ?acetaminophen 325 MG tablet ?Commonly known as: TYLENOL ?Take 2 tablets (650 mg total) by mouth every 6 (six) hours as needed for mild pain (or Fever >/= 101). ?  ?CALCIUM PO ?Take by mouth. ?  ?FERROUS SULFATE PO ?Take by mouth. ?  ?fluocinonide cream 0.05 % ?Commonly known as: LIDEX ?Apply 1 application topically 2 (two) times daily. ?  ?fluticasone 50 MCG/ACT nasal spray ?Commonly known as: FLONASE ?Place 2 sprays into both nostrils daily. ?  ?rOPINIRole 1 MG tablet ?Commonly known as: REQUIP ?Take 1 tablet (1 mg total) by mouth at bedtime. ?  ?VITAMIN B-12 PO ?Take by mouth. ?  ?VITAMIN D3 PO ?Take by mouth. ?  ? ?  ? ?Allergies  ?Allergen Reactions  ? Dilaudid [Hydromorphone] Itching  ? ? ? ? ?The results of significant diagnostics from this hospitalization (including imaging, microbiology, ancillary and laboratory) are listed below for reference.   ? ?Significant Diagnostic Studies: ?US Transvaginal Non-OB ? ?Result Date: 08/07/2021 ?CLINICAL DATA:  Left lower quadrant pain. EXAM: TRANSABDOMINAL AND TRANSVAGINAL ULTRASOUND OF PELVIS DOPPLER ULTRASOUND OF OVARIES TECHNIQUE: Both transabdominal and transvaginal ultrasound examinations of the pelvis were performed. Transabdominal technique was performed for global imaging of the pelvis including uterus, ovaries, adnexal regions, and pelvic cul-de-sac. It was necessary to proceed with endovaginal exam following the transabdominal exam to visualize the bilateral ovaries. Color and duplex Doppler ultrasound was utilized to evaluate blood flow to the ovaries. COMPARISON:  None. FINDINGS: Uterus Measurements: 8.8 x 4.7 x 5.4 cm = volume: 117 mL. No fibroids or other mass visualized. Endometrium Thickness: 7 mm.  No focal abnormality visualized. Right ovary Measurements: 2.6 x 1.5 x 3.4 cm = volume: 7 mL. Normal appearance/no adnexal mass. Left ovary Measurements: 2.4 x 2.1 x 2.1 cm = volume: 5 mL. Normal  appearance/no adnexal mass. Pulsed Doppler evaluation demonstrates normal low-resistance arterial and venous waveforms in both ovaries. Other: Likely trace free fluid within the left adnexa. IMPRESSION: 1. Likely trace free fluid within the left adnexa. 2. Otherwise unremarkable pelvic ultrasound. Electronically Signed   By: Tish Frederickson M.D.   On: 08/07/2021 23:00  ? ?US Pelvis Complete ? ?Result Date: 08/07/2021 ?CLINICAL DATA:  Left lower quadrant pain. EXAM: TRANSABDOMINAL AND TRANSVAGINAL ULTRASOUND OF PELVIS DOPPLER ULTRASOUND OF OVARIES TECHNIQUE: Both transabdominal and transvaginal ultrasound examinations of the pelvis were performed. Transabdominal technique was performed for global imaging of the pelvis including uterus, ovaries, adnexal regions, and pelvic cul-de-sac. It was necessary to proceed with endovaginal exam following the transabdominal exam to visualize the bilateral ovaries. Color and duplex Doppler ultrasound was utilized to evaluate blood flow to the ovaries. COMPARISON:  None. FINDINGS: Uterus Measurements: 8.8 x 4.7 x 5.4 cm = volume: 117 mL. No fibroids or other mass visualized. Endometrium Thickness: 7 mm.  No focal abnormality visualized. Right ovary Measurements: 2.6 x 1.5 x 3.4 cm = volume: 7 mL. Normal appearance/no adnexal mass. Left ovary Measurements: 2.4 x 2.1 x 2.1 cm = volume: 5 mL. Normal appearance/no adnexal  mass. Pulsed Doppler evaluation demonstrates normal low-resistance arterial and venous waveforms in both ovaries. Other: Likely trace free fluid within the left adnexa. IMPRESSION: 1. Likely trace free fluid within the left adnexa. 2. Otherwise unremarkable pelvic ultrasound. Electronically Signed   By: Tish Frederickson M.D.   On: 08/07/2021 23:00  ? ?CT Abdomen Pelvis W Contrast ? ?Result Date: 08/07/2021 ?CLINICAL DATA:  LLQ abdominal pain EXAM: CT ABDOMEN AND PELVIS WITH CONTRAST TECHNIQUE: Multidetector CT imaging of the abdomen and pelvis was performed using the  standard protocol following bolus administration of intravenous contrast. RADIATION DOSE REDUCTION: This exam was performed according to the departmental dose-optimization program which includes automated ex

## 2021-08-13 ENCOUNTER — Ambulatory Visit: Payer: BC Managed Care – PPO | Admitting: Family Medicine

## 2021-08-13 ENCOUNTER — Encounter: Payer: Self-pay | Admitting: Family Medicine

## 2021-08-13 VITALS — BP 117/77 | HR 115 | Temp 98.3°F | Ht 64.0 in | Wt 210.5 lb

## 2021-08-13 DIAGNOSIS — G2581 Restless legs syndrome: Secondary | ICD-10-CM

## 2021-08-13 DIAGNOSIS — R1012 Left upper quadrant pain: Secondary | ICD-10-CM | POA: Diagnosis not present

## 2021-08-13 DIAGNOSIS — F331 Major depressive disorder, recurrent, moderate: Secondary | ICD-10-CM

## 2021-08-13 DIAGNOSIS — K2100 Gastro-esophageal reflux disease with esophagitis, without bleeding: Secondary | ICD-10-CM

## 2021-08-13 DIAGNOSIS — F411 Generalized anxiety disorder: Secondary | ICD-10-CM

## 2021-08-13 DIAGNOSIS — R0683 Snoring: Secondary | ICD-10-CM

## 2021-08-13 MED ORDER — ROPINIROLE HCL 2 MG PO TABS: 2.0000 mg | ORAL_TABLET | Freq: Every day | ORAL | 3 refills | Status: DC

## 2021-08-13 NOTE — Patient Instructions (Signed)
Managing Anxiety, Adult ?After being diagnosed with anxiety, you may be relieved to know why you have felt or behaved a certain way. You may also feel overwhelmed about the treatment ahead and what it will mean for your life. With care and support, you can manage this condition. ?How to manage lifestyle changes ?Managing stress and anxiety ?Stress is your body's reaction to life changes and events, both good and bad. Most stress will last just a few hours, but stress can be ongoing and can lead to more than just stress. Although stress can play a major role in anxiety, it is not the same as anxiety. Stress is usually caused by something external, such as a deadline, test, or competition. Stress normally passes after the triggering event has ended.  ?Anxiety is caused by something internal, such as imagining a terrible outcome or worrying that something will go wrong that will devastate you. Anxiety often does not go away even after the triggering event is over, and it can become long-term (chronic) worry. It is important to understand the differences between stress and anxiety and to manage your stress effectively so that it does not lead to an anxious response. ?Talk with your health care provider or a counselor to learn more about reducing anxiety and stress. He or she may suggest tension reduction techniques, such as: ?Music therapy. Spend time creating or listening to music that you enjoy and that inspires you. ?Mindfulness-based meditation. Practice being aware of your normal breaths while not trying to control your breathing. It can be done while sitting or walking. ?Centering prayer. This involves focusing on a word, phrase, or sacred image that means something to you and brings you peace. ?Deep breathing. To do this, expand your stomach and inhale slowly through your nose. Hold your breath for 3-5 seconds. Then exhale slowly, letting your stomach muscles relax. ?Self-talk. Learn to notice and identify  thought patterns that lead to anxiety reactions and change those patterns to thoughts that feel peaceful. ?Muscle relaxation. Taking time to tense muscles and then relax them. ?Choose a tension reduction technique that fits your lifestyle and personality. These techniques take time and practice. Set aside 5-15 minutes a day to do them. Therapists can offer counseling and training in these techniques. The training to help with anxiety may be covered by some insurance plans. ?Other things you can do to manage stress and anxiety include: ?Keeping a stress diary. This can help you learn what triggers your reaction and then learn ways to manage your response. ?Thinking about how you react to certain situations. You may not be able to control everything, but you can control your response. ?Making time for activities that help you relax and not feeling guilty about spending your time in this way. ?Doing visual imagery. This involves imagining or creating mental pictures to help you relax. ?Practicing yoga. Through yoga poses, you can lower tension and promote relaxation. ? ?Medicines ?Medicines can help ease symptoms. Medicines for anxiety include: ?Antidepressant medicines. These are usually prescribed for long-term daily control. ?Anti-anxiety medicines. These may be added in severe cases, especially when panic attacks occur. ?Medicines will be prescribed by a health care provider. When used together, medicines, psychotherapy, and tension reduction techniques may be the most effective treatment. ?Relationships ?Relationships can play a big part in helping you recover. Try to spend more time connecting with trusted friends and family members. ?Consider going to couples counseling if you have a partner, taking family education classes, or going to family   therapy. ?Therapy can help you and others better understand your condition. ?How to recognize changes in your anxiety ?Everyone responds differently to treatment for  anxiety. Recovery from anxiety happens when symptoms decrease and stop interfering with your daily activities at home or work. This may mean that you will start to: ?Have better concentration and focus. Worry will interfere less in your daily thinking. ?Sleep better. ?Be less irritable. ?Have more energy. ?Have improved memory. ?It is also important to recognize when your condition is getting worse. Contact your health care provider if your symptoms interfere with home or work and you feel like your condition is not improving. ?Follow these instructions at home: ?Activity ?Exercise. Adults should do the following: ?Exercise for at least 150 minutes each week. The exercise should increase your heart rate and make you sweat (moderate-intensity exercise). ?Strengthening exercises at least twice a week. ?Get the right amount and quality of sleep. Most adults need 7-9 hours of sleep each night. ?Lifestyle ? ?Eat a healthy diet that includes plenty of vegetables, fruits, whole grains, low-fat dairy products, and lean protein. ?Do not eat a lot of foods that are high in fats, added sugars, or salt (sodium). ?Make choices that simplify your life. ?Do not use any products that contain nicotine or tobacco. These products include cigarettes, chewing tobacco, and vaping devices, such as e-cigarettes. If you need help quitting, ask your health care provider. ?Avoid caffeine, alcohol, and certain over-the-counter cold medicines. These may make you feel worse. Ask your pharmacist which medicines to avoid. ?General instructions ?Take over-the-counter and prescription medicines only as told by your health care provider. ?Keep all follow-up visits. This is important. ?Where to find support ?You can get help and support from these sources: ?Self-help groups. ?Online and community organizations. ?A trusted spiritual leader. ?Couples counseling. ?Family education classes. ?Family therapy. ?Where to find more information ?You may find  that joining a support group helps you deal with your anxiety. The following sources can help you locate counselors or support groups near you: ?Mental Health America: www.mentalhealthamerica.net ?Anxiety and Depression Association of America (ADAA): www.adaa.org ?National Alliance on Mental Illness (NAMI): www.nami.org ?Contact a health care provider if: ?You have a hard time staying focused or finishing daily tasks. ?You spend many hours a day feeling worried about everyday life. ?You become exhausted by worry. ?You start to have headaches or frequently feel tense. ?You develop chronic nausea or diarrhea. ?Get help right away if: ?You have a racing heart and shortness of breath. ?You have thoughts of hurting yourself or others. ?If you ever feel like you may hurt yourself or others, or have thoughts about taking your own life, get help right away. Go to your nearest emergency department or: ?Call your local emergency services (911 in the [place] ?Call a suicide crisis helpline, such as the National Suicide Prevention Lifeline at 1-800-273-8255 or 988 in the [place] This is open 24 hours a day in the [place] ?Text the Crisis Text Line at 741741 (in the [place] ?Summary ?Taking steps to learn and use tension reduction techniques can help calm you and help prevent triggering an anxiety reaction. ?When used together, medicines, psychotherapy, and tension reduction techniques may be the most effective treatment. ?Family, friends, and partners can play a big part in supporting you. ?This information is not intended to replace advice given to you by your health care provider. Make sure you discuss any questions you have with your health care provider. ?Document Revised: 11/22/2020 Document Reviewed: 08/20/2020 ?Elsevier Patient   Education ? 2022 [employer] ? ?

## 2021-08-13 NOTE — Progress Notes (Signed)
? ?Established Patient Office Visit ? ?Subjective:  ?Patient ID: Amanda Clarke, female    DOB: 1980/11/06  Age: 41 y.o. MRN: [id number] ? ?CC:  ?Chief Complaint  ?Patient presents with  ? Insomnia  ? ? ?HPI ?[name] presents for chronic follow up.  ? ?She does need a referral to GI. She was recently admitted to the hospital for intractable abdominal pain. She had a normal transvaginal US, pelvic US, abdomen/pelvis CT, and unremarkable labs. She was treated with pain medications and told to follow up with GI for further management. She reports that she has had the pain in the past and they have not found a cause. She reports that the pain has been improving. She is not taking nexium. She states that she did not have GERD until she started taking nexium so she threw out the medication. She now reports heartburn.  ? ?She is going to a weight loss clinic. She cant remember the name of the medication that they are giving her but believes it is phentermine. She is doing working on her diet and exercise. She was taking remeron for depression but stopped due to weight gain. She does not wish to take any medications for depression or anxiety at this time. Denies SI.   ? ?She also reports insomnia for last 4 months or so. She reports sleeping 2-3 hours a night. She reports that she will often have to get up to take a warm bath because of her restless leg syndrome. She is currently taking 1 mg of requip and this is not very helpful. For awhile she was taking 2 mg of requip with good relief. She had tried melatonin and unisom OTC without improvement. She also does not wish to try medications for sleep at this time.  ? ?She is also concerned about sleep apnea. She reports that she snores loudly and that her husband has heard her gasping at night. Both her mother and father have OSA and wear CPAP.  ? ? ? ?  08/13/2021  ?  1:22 PM 07/02/2021  ?  2:06 PM 05/28/2021  ?  1:08 PM  ?Depression screen PHQ 2/9  ?Decreased Interest _0 ?Down, Depressed, Hopeless _1 ?PHQ - 2 Score _2 ?Altered sleeping _3 ?Tired, decreased energy _4 ?Change in appetite 0 1 2  ?Feeling bad or failure about yourself  _5 ?Trouble concentrating 0 0 1  ?Moving slowly or fidgety/restless 1 0 1  ?Suicidal thoughts 0 1 0  ?PHQ-9 Score _6 ?Difficult doing work/chores Extremely dIfficult Somewhat difficult Somewhat difficult  ? ? ?  08/13/2021  ?  1:24 PM 07/02/2021  ?  2:06 PM 05/28/2021  ?  1:09 PM 04/16/2021  ? 10:56 AM  ?GAD 7 : Generalized Anxiety Score  ?Nervous, Anxious, on Edge _7 ?Control/stop worrying _8 ?Worry too much - different things _9 ?Trouble relaxing _10 ?Restless _11 ?Easily annoyed or irritable _12 ?Afraid - awful might happen _13 ?Total GAD 7 Score _14 ?Anxiety Difficulty Very difficult Very difficult Somewhat difficult Somewhat difficult  ? ? ? ?Past Medical History:  ?Diagnosis Date  ? Anxiety   ? IBS (irritable bowel syndrome)   ? Recurrent kidney stones   ? ? ?  Past Surgical History:  ?Procedure Laterality Date  ? CHOLECYSTECTOMY  2006  ? COLONOSCOPY W/ BIOPSIES AND POLYPECTOMY  06/2020  ? needs to repeat in 5 years  ? KIDNEY STONE SURGERY    ? ? ?Family History  ?Problem Relation Age of Onset  ? Depression Mother   ? Vision loss Father   ? Kidney disease Father   ? Heart disease Father   ? Diabetes Father   ? ? ?Social History  ? ?Socioeconomic History  ? Marital status: Married  ?  Spouse name: [name]  ? Number of children: 2  ? Years of education: 81  ? Highest education level: High school graduate  ?Occupational History  ? Not on file  ?Tobacco Use  ? Smoking status: Never  ? Smokeless tobacco: Never  ?Vaping Use  ? Vaping Use: Never used  ?Substance and Sexual Activity  ? Alcohol use: Yes  ?  Alcohol/week: 1.0 standard drink  ?  Types: 1 Glasses of wine per week  ? Drug use: Not Currently  ? Sexual activity: Yes  ?  Birth control/protection: None  ?Other Topics Concern   ? Not on file  ?Social History Narrative  ? Not on file  ? ?Social Determinants of Health  ? ?Financial Resource Strain: Not on file  ?Food Insecurity: Not on file  ?Transportation Needs: Not on file  ?Physical Activity: Not on file  ?Stress: Not on file  ?Social Connections: Not on file  ?Intimate Partner Violence: Not on file  ? ? ?Outpatient Medications Prior to Visit  ?Medication Sig Dispense Refill  ? acetaminophen (TYLENOL) 325 MG tablet Take 2 tablets (650 mg total) by mouth every 6 (six) hours as needed for mild pain (or Fever >/= 101).    ? CALCIUM PO Take by mouth.    ? Cyanocobalamin (VITAMIN B-12 PO) Take by mouth.    ? FERROUS SULFATE PO Take by mouth.    ? fluocinonide cream (LIDEX) 1.61 % Apply 1 application topically 2 (two) times daily. 30 g 5  ? fluticasone (FLONASE) 50 MCG/ACT nasal spray Place 2 sprays into both nostrils daily. 16 g 6  ? rOPINIRole (REQUIP) 1 MG tablet Take 1 tablet (1 mg total) by mouth at bedtime. 90 tablet 1  ? Cholecalciferol (VITAMIN D3 PO) Take by mouth. (Patient not taking: Reported on 08/13/2021)    ? ?No facility-administered medications prior to visit.  ? ? ?Allergies  ?Allergen Reactions  ? Dilaudid [Hydromorphone] Itching  ? ? ?ROS ?Review of Systems ?Negative unless specially indicated above in HPI. ?  ?Objective:  ?  ?Physical Exam ?Vitals and nursing note reviewed.  ?Constitutional:   ?   General: She is not in acute distress. ?   Appearance: She is not ill-appearing, toxic-appearing or diaphoretic.  ?HENT:  ?   Head: Normocephalic and atraumatic.  ?   Nose: Nose normal.  ?Eyes:  ?   Extraocular Movements: Extraocular movements intact.  ?   Pupils: Pupils are equal, round, and reactive to light.  ?Neck:  ?   Thyroid: No thyroid mass, thyromegaly or thyroid tenderness.  ?Cardiovascular:  ?   Rate and Rhythm: Normal rate and regular rhythm.  ?   Heart sounds: Normal heart sounds. No murmur heard. ?Pulmonary:  ?   Effort: Pulmonary effort is normal. No respiratory  distress.  ?   Breath sounds: Normal breath sounds.  ?Abdominal:  ?   General: Bowel sounds are normal. There is no distension.  ?  Palpations: Abdomen is soft.  ?   Tenderness: There is abdominal tenderness in the left upper quadrant and left lower quadrant. There is no right CVA tenderness, left CVA tenderness, guarding or rebound. Negative signs include Murphy's sign, Rovsing's sign and McBurney's sign.  ?Musculoskeletal:  ?   Right lower leg: No edema.  ?   Left lower leg: No edema.  ?Skin: ?   General: Skin is warm and dry.  ?Neurological:  ?   General: No focal deficit present.  ?   Mental Status: She is alert and oriented to person, place, and time.  ?Psychiatric:     ?   Mood and Affect: Mood normal.     ?   Behavior: Behavior normal.  ? ? ?BP 117/77   Pulse (!) 115   Temp 98.3 ?F (36.8 ?C) (Temporal)   Ht _0  (1.626 m)   Wt 210 lb 8 oz (95.5 kg)   BMI 36.13 kg/m?  ?Wt Readings from Last 3 Encounters:  ?08/13/21 210 lb 8 oz (95.5 kg)  ?08/09/21 214 lb 15.2 oz (97.5 kg)  ?07/02/21 210 lb (95.3 kg)  ? ? ? ?Health Maintenance Due  ?Topic Date Due  ? PAP SMEAR-Modifier  Never done  ? ? ?There are no preventive care reminders to display for this patient. ? ?Lab Results  ?Component Value Date  ? TSH 1.960 04/16/2021  ? ?Lab Results  ?Component Value Date  ? WBC 8.1 08/09/2021  ? HGB 10.2 (L) 08/09/2021  ? HCT 33.1 (L) 08/09/2021  ? MCV 64.0 (L) 08/09/2021  ? PLT 306 08/09/2021  ? ?Lab Results  ?Component Value Date  ? NA 139 08/09/2021  ? K 3.8 08/09/2021  ? CO2 26 08/09/2021  ? GLUCOSE 98 08/09/2021  ? BUN 10 08/09/2021  ? CREATININE 0.66 08/09/2021  ? BILITOT 0.5 08/08/2021  ? ALKPHOS 78 08/08/2021  ? AST 16 08/08/2021  ? ALT 23 08/08/2021  ? PROT 6.5 08/08/2021  ? ALBUMIN 3.4 (L) 08/08/2021  ? CALCIUM 8.7 (L) 08/09/2021  ? ANIONGAP 6 08/09/2021  ? EGFR 115 04/16/2021  ? ?Lab Results  ?Component Value Date  ? CHOL 148 04/16/2021  ? ?Lab Results  ?Component Value Date  ? HDL 62 04/16/2021  ? ?Lab Results   ?Component Value Date  ? [place] 72 04/16/2021  ? ?Lab Results  ?Component Value Date  ? TRIG 74 04/16/2021  ? ?Lab Results  ?Component Value Date  ? CHOLHDL 2.4 04/16/2021  ? ?No results found for: HGBA1C ?

## 2021-08-20 DIAGNOSIS — G4719 Other hypersomnia: Secondary | ICD-10-CM | POA: Diagnosis not present

## 2021-08-21 DIAGNOSIS — G4719 Other hypersomnia: Secondary | ICD-10-CM | POA: Diagnosis not present

## 2021-08-27 DIAGNOSIS — G4733 Obstructive sleep apnea (adult) (pediatric): Secondary | ICD-10-CM | POA: Diagnosis not present

## 2021-09-06 DIAGNOSIS — G4733 Obstructive sleep apnea (adult) (pediatric): Secondary | ICD-10-CM | POA: Diagnosis not present

## 2021-10-17 ENCOUNTER — Other Ambulatory Visit: Payer: Self-pay | Admitting: Family Medicine

## 2021-11-06 ENCOUNTER — Telehealth: Payer: Self-pay | Admitting: Family Medicine

## 2021-11-06 ENCOUNTER — Other Ambulatory Visit: Payer: Self-pay | Admitting: Nurse Practitioner

## 2021-11-06 DIAGNOSIS — G2581 Restless legs syndrome: Secondary | ICD-10-CM

## 2021-11-07 MED ORDER — ROPINIROLE HCL 2 MG PO TABS: 2.0000 mg | ORAL_TABLET | Freq: Every day | ORAL | 3 refills | Status: DC

## 2021-11-07 NOTE — Telephone Encounter (Signed)
Left message advising rx sent in and to call back with any further questions or concerns.

## 2021-11-07 NOTE — Telephone Encounter (Signed)
Rx sent 

## 2021-11-19 ENCOUNTER — Encounter: Payer: Self-pay | Admitting: Family Medicine

## 2021-11-19 ENCOUNTER — Ambulatory Visit (INDEPENDENT_AMBULATORY_CARE_PROVIDER_SITE_OTHER): Payer: BC Managed Care – PPO

## 2021-11-19 ENCOUNTER — Ambulatory Visit: Payer: BC Managed Care – PPO | Admitting: Family Medicine

## 2021-11-19 VITALS — BP 104/70 | HR 86 | Temp 98.5°F | Ht 64.0 in | Wt 216.4 lb

## 2021-11-19 DIAGNOSIS — G8929 Other chronic pain: Secondary | ICD-10-CM

## 2021-11-19 DIAGNOSIS — D563 Thalassemia minor: Secondary | ICD-10-CM

## 2021-11-19 DIAGNOSIS — M25551 Pain in right hip: Secondary | ICD-10-CM | POA: Diagnosis not present

## 2021-11-19 DIAGNOSIS — R6889 Other general symptoms and signs: Secondary | ICD-10-CM | POA: Diagnosis not present

## 2021-11-19 DIAGNOSIS — M25571 Pain in right ankle and joints of right foot: Secondary | ICD-10-CM | POA: Diagnosis not present

## 2021-11-19 DIAGNOSIS — R609 Edema, unspecified: Secondary | ICD-10-CM | POA: Diagnosis not present

## 2021-11-19 DIAGNOSIS — M25572 Pain in left ankle and joints of left foot: Secondary | ICD-10-CM

## 2021-11-19 DIAGNOSIS — G2581 Restless legs syndrome: Secondary | ICD-10-CM

## 2021-11-19 DIAGNOSIS — D509 Iron deficiency anemia, unspecified: Secondary | ICD-10-CM | POA: Diagnosis not present

## 2021-11-19 MED ORDER — ROPINIROLE HCL 3 MG PO TABS: 3.0000 mg | ORAL_TABLET | Freq: Every day | ORAL | 1 refills | Status: DC

## 2021-11-19 NOTE — Progress Notes (Signed)
Established Patient Office Visit  Subjective   Patient ID: Amanda Clarke, female    DOB: 30-Mar-1981  Age: 41 y.o. MRN: [id number]  Chief Complaint  Patient presents with   Hip Pain   Edema    Hip Pain    Shawndra reports pain in her right hip x 1 month. It is constant and achy. It feels like it is in the bone. It feel stiff, especially after rest. It does not radiate. Denies numbness, tingling, decreased ROM, swelling, or injury.   Reports swelling of both feet by the end of the day. This occurs every day. Swelling improves overnight and she wakes up without swelling. She did have an injury of her left ankle 1 year ago with a fall. It has hurt in the back on her ankle since this. She did not have this evaluated after the injury.   She continues to RLS although it has improved with requip. She also has a history of chronic anemia as well as a thalassemia trait She does take an iron supplement daily. She has had iron infusions in the past, last was many years ago. She also continue to feel fatigued although this has improved since her OSA dx and treatment.    Past Medical History:  Diagnosis Date   Anxiety    IBS (irritable bowel syndrome)    Recurrent kidney stones       ROS Negative unless specially indicated above in HPI.   Objective:     BP 104/70   Pulse 86   Temp 98.5 F (36.9 C) (Temporal)   Ht 5\' 4"  (1.626 m)   Wt 216 lb 6 oz (98.1 kg)   SpO2 96%   BMI 37.14 kg/m    Physical Exam Vitals and nursing note reviewed.  Constitutional:      General: She is not in acute distress.    Appearance: She is not ill-appearing, toxic-appearing or diaphoretic.  Cardiovascular:     Rate and Rhythm: Normal rate and regular rhythm.     Heart sounds: Normal heart sounds. No murmur heard. Pulmonary:     Effort: Pulmonary effort is normal. No respiratory distress.     Breath sounds: Normal breath sounds.  Abdominal:     General: Bowel sounds are normal. There is no  distension.     Palpations: Abdomen is soft.     Tenderness: There is no abdominal tenderness. There is no guarding or rebound.  Musculoskeletal:     Right hip: Normal.     Right lower leg: No edema.     Left lower leg: No edema.     Right ankle: No swelling, deformity, ecchymosis or lacerations. No tenderness. Normal range of motion.     Right Achilles Tendon: No tenderness or defects. Thompson's test negative.     Left ankle: No swelling, deformity, ecchymosis or lacerations. Tenderness (posterior) present. Normal range of motion.     Left Achilles Tendon: No tenderness or defects. Thompson's test negative.  Skin:    General: Skin is warm and dry.  Neurological:     General: No focal deficit present.     Mental Status: She is alert and oriented to person, place, and time.     Motor: No weakness.     Gait: Gait normal.  Psychiatric:        Mood and Affect: Mood normal.        Behavior: Behavior normal.      No results found for any visits on  11/19/21.    The ASCVD Risk score (Arnett DK, et al., 2019) failed to calculate for the following reasons:   The 2019 ASCVD risk score is only valid for ages 46 to 95    Assessment & Plan:   Torre was seen today for hip pain and edema.  Diagnoses and all orders for this visit:  Peripheral edema Benign exam. Normal BP today. Discussed low salt diet, elevation, compression socks.   Chronic pain of left ankle Xray today, report pending. RICE therapy, referral placed.  -     Ambulatory referral to Orthopedic Surgery -     DG Ankle Complete Left; Future  Acute pain of right hip Xray today, report pending. Rest, ice, heat, stretching, NSAIDs, tylenol. Referral placed.  -     Ambulatory referral to Orthopedic Surgery -     DG Hip Unilat W OR W/O Pelvis 2-3 Views Right; Future  Restless leg syndrome Will increase requip to 3 mg daily. Will check CBC today aswell.  -     rOPINIRole (REQUIP) 3 MG tablet; Take 1 tablet (3 mg total)  by mouth at bedtime.  Iron deficiency anemia, unspecified iron deficiency anemia type Thalassemia trait On daily iron supplement. CBC pending, will place referral to hematology if needed pending labs.  -     Anemia Profile B   Return in about 3 months (around 02/19/2022) for CPE.   The patient indicates understanding of these issues and agrees with the plan.  Gabriel Earing, FNP

## 2021-11-19 NOTE — Patient Instructions (Signed)
Edema  Edema is an abnormal buildup of fluids in the body tissues and under the skin. Swelling of the legs, feet, and ankles is a common symptom that becomes more likely as you get older. Swelling is also common in looser tissues, such as around the eyes. Pressing on the area may make a temporary dent in your skin (pitting edema). This fluid may also accumulate in your lungs (pulmonary edema). There are many possible causes of edema. Eating too much salt (sodium) and being on your feet or sitting for a long time can cause edema in your legs, feet, and ankles. Common causes of edema include: Certain medical conditions, such as heart failure, liver or kidney disease, and cancer. Weak leg blood vessels. An injury. Pregnancy. Medicines. Being obese. Low protein levels in the blood. Hot weather may make edema worse. Edema is usually painless. Your skin may look swollen or shiny. Follow these instructions at home: Medicines Take over-the-counter and prescription medicines only as told by your health care provider. Your health care provider may prescribe a medicine to help your body get rid of extra water (diuretic). Take this medicine if you are told to take it. Eating and drinking Eat a low-salt (low-sodium) diet to reduce fluid as told by your health care provider. Sometimes, eating less salt may reduce swelling. Depending on the cause of your swelling, you may need to limit how much fluid you drink (fluid restriction). General instructions Raise (elevate) the injured area above the level of your heart while you are sitting or lying down. Do not sit still or stand for long periods of time. Do not wear tight clothing. Do not wear garters on your upper legs. Exercise your legs to get your circulation going. This helps to move the fluid back into your blood vessels, and it may help the swelling go down. Wear compression stockings as told by your health care provider. These stockings help to prevent  blood clots and reduce swelling in your legs. It is important that these are the correct size. These stockings should be prescribed by your health care provider to prevent possible injuries. If elastic bandages or wraps are recommended, use them as told by your health care provider. Contact a health care provider if: Your edema does not get better with treatment. You have heart, liver, or kidney disease and have symptoms of edema. You have sudden and unexplained weight gain. Get help right away if: You develop shortness of breath or chest pain. You cannot breathe when you lie down. You develop pain, redness, or warmth in the swollen areas. You have heart, liver, or kidney disease and suddenly get edema. You have a fever and your symptoms suddenly get worse. These symptoms may be an emergency. Get help right away. Call 911. Do not wait to see if the symptoms will go away. Do not drive yourself to the hospital. Summary Edema is an abnormal buildup of fluids in the body tissues and under the skin. Eating too much salt (sodium)and being on your feet or sitting for a long time can cause edema in your legs, feet, and ankles. Raise (elevate) the injured area above the level of your heart while you are sitting or lying down. Follow your health care provider's instructions about diet and how much fluid you can drink. This information is not intended to replace advice given to you by your health care provider. Make sure you discuss any questions you have with your health care provider. Document Revised: 01/01/2021 Document   Reviewed: 01/01/2021 Elsevier Patient Education  2023 [employer]  

## 2021-11-20 ENCOUNTER — Other Ambulatory Visit: Payer: Self-pay | Admitting: Family Medicine

## 2021-11-20 LAB — ANEMIA PROFILE B
Basophils Absolute: 0 10*3/uL (ref 0.0–0.2)
Basos: 0 %
EOS (ABSOLUTE): 0.2 10*3/uL (ref 0.0–0.4)
Eos: 2 %
Ferritin: 96 ng/mL (ref 15–150)
Folate: 6.9 ng/mL (ref >3.0)
Hematocrit: 32.2 % — ABNORMAL LOW (ref 34.0–46.6)
Hemoglobin: 10.2 g/dL — ABNORMAL LOW (ref 11.1–15.9)
Immature Grans (Abs): 0 10*3/uL (ref 0.0–0.1)
Immature Granulocytes: 0 %
Iron Saturation: 15 % (ref 15–55)
Iron: 48 ug/dL (ref 27–159)
Lymphocytes Absolute: 2.6 10*3/uL (ref 0.7–3.1)
Lymphs: 29 %
MCH: 20.2 pg — ABNORMAL LOW (ref 26.6–33.0)
MCHC: 31.7 g/dL (ref 31.5–35.7)
MCV: 64 fL — ABNORMAL LOW (ref 79–97)
Monocytes Absolute: 0.7 10*3/uL (ref 0.1–0.9)
Monocytes: 7 %
Neutrophils Absolute: 5.5 10*3/uL (ref 1.4–7.0)
Neutrophils: 62 %
Platelets: 356 10*3/uL (ref 150–450)
RBC: 5.04 x10E6/uL (ref 3.77–5.28)
RDW: 17 % — ABNORMAL HIGH (ref 11.7–15.4)
Retic Ct Pct: 2.4 % (ref 0.6–2.6)
Total Iron Binding Capacity: 327 ug/dL (ref 250–450)
UIBC: 279 ug/dL (ref 131–425)
Vitamin B-12: 582 pg/mL (ref 232–1245)
WBC: 9 10*3/uL (ref 3.4–10.8)

## 2021-11-21 ENCOUNTER — Other Ambulatory Visit: Payer: Self-pay | Admitting: Family Medicine

## 2021-11-21 DIAGNOSIS — D649 Anemia, unspecified: Secondary | ICD-10-CM

## 2021-11-23 ENCOUNTER — Telehealth: Payer: Self-pay | Admitting: Hematology

## 2021-11-23 NOTE — Telephone Encounter (Signed)
Scheduled appt per 7/12 referral. Pt is aware of appt date and time. Pt is aware to arrive 15 mins prior to appt time and to bring and updated insurance card. Pt is aware of appt location.   

## 2021-12-21 ENCOUNTER — Inpatient Hospital Stay: Payer: BC Managed Care – PPO | Attending: Hematology | Admitting: Hematology

## 2021-12-21 ENCOUNTER — Other Ambulatory Visit: Payer: Self-pay

## 2021-12-21 ENCOUNTER — Encounter: Payer: Self-pay | Admitting: Hematology

## 2021-12-21 VITALS — BP 121/89 | HR 93 | Temp 98.4°F | Resp 18 | Wt 215.2 lb

## 2021-12-21 DIAGNOSIS — D563 Thalassemia minor: Secondary | ICD-10-CM | POA: Insufficient documentation

## 2021-12-21 DIAGNOSIS — D649 Anemia, unspecified: Secondary | ICD-10-CM

## 2021-12-21 DIAGNOSIS — D508 Other iron deficiency anemias: Secondary | ICD-10-CM | POA: Diagnosis not present

## 2021-12-21 NOTE — Progress Notes (Signed)
[facility]   Telephone:([phone]) [phone] Fax:(336) [phone]   Clinic New consult Note   Patient Care Team: Gabriel Earing, FNP as PCP - General (Family Medicine) 12/21/2021  CHIEF COMPLAINTS/PURPOSE OF CONSULTATION:  Anemia and fatigue   ASSESSMENT & PLAN:  Amanda Clarke is a 41 y.o.  female with a history of mild IDA  1.  Anemia secondary to iron deficiency and thalassemia trait -she has history of low hgb with hemoglobin 11-12, she she has thalassemia trait. Iron, ferritin, vit B12, and folate levels have been WNL with oral supplementation.  Her menstrual period used to be very heavy, but has been light to moderate in the past many years. -However she developed worsening fatigue, which has impacted her functional level, no craving for ice or certain food. CBC in past 4 months showed worse anemia with Hg 10.2-10.3  -she had colonoscopy in 2022, which showed a few polyps  -will repeat iron study, TSH, methylmalonic level, reticulocyte count -If her iron level is on the low normal, I may give her low-dose IV iron to see if it improves her symptoms. -Given her young age, primary bone marrow disease such as MDS is much less likely.  She does not have B symptoms, no clinical suspicion for lymphoma    PLAN: -lab next week, including CBC with differential, reticulocyte count, LDH, ferritin, iron study, malonic acid level, copper and heavy metal -I will call her with results    HISTORY OF PRESENTING ILLNESS:  Amanda Clarke 41 y.o. female is here because of worsening anemia and fatigue.  She was referred by her primary care physician.   She has history of chronic anemia and a thalassemia trait.  Her hemoglobin is usually in the range of 11-12.  Her menstrual period was very heavy when she was younger, and it has been light to moderate in the past several years.  Denies any other bleedings, she had a colonoscopy a year ago she was unremarkable except a few polyps.  She  has developed worsening fatigue in the past few months.  She has mild dyspnea on exertion, no chest pain.  She is able to function most time, but does feel exhausted after work, has decreased her activity level lately.  She had repeated CBC and anemia work-up on November 19, 2021, which showed hemoglobin 10.2, 96, B12 582, normal iron and TIBC.  She had not noticed any recent bleeding such as epistaxis, hematuria or hematochezia. She has history of hematochezia in 06/2020 which prompted a colonoscopy. The patient denies over the counter NSAID ingestion. She is not on antiplatelets agents. Her last colonoscopy was 06/28/20 at [facility], path results showed only one tubular adenoma, otherwise benign. She had no prior history or diagnosis of cancer. Her age appropriate screening programs are up-to-date. She denies any pica and eats a variety of diet. She previously received IV iron "many years ago" The patient takes OTC ferrous sulfate  She has a PMH of -RLS, on requip -OSA -anxiety -s/p cholecystectomy 2006   REVIEW OF SYSTEMS:   Constitutional: Denies fevers, chills or abnormal night sweats, (+) fatigue, no weight loss  Eyes: Denies blurriness of vision, double vision or watery eyes Ears, nose, mouth, throat, and face: Denies mucositis or sore throat Respiratory: Denies cough, dyspnea or wheezes Cardiovascular: Denies palpitation, chest discomfort or lower extremity swelling Gastrointestinal:  Denies nausea, heartburn or change in bowel habits Skin: Denies abnormal skin rashes Lymphatics: Denies new lymphadenopathy or easy bruising Neurological:Denies numbness,  tingling or new weaknesses Behavioral/Psych: Mood is stable, no new changes   All other systems were reviewed with the patient and are negative.   MEDICAL HISTORY:  Past Medical History:  Diagnosis Date   Anemia    Anxiety    Heart murmur    IBS (irritable bowel syndrome)    Recurrent kidney stones     SURGICAL HISTORY: Past  Surgical History:  Procedure Laterality Date   CHOLECYSTECTOMY  2006   COLONOSCOPY W/ BIOPSIES AND POLYPECTOMY  06/2020   needs to repeat in 5 years   KIDNEY STONE SURGERY      SOCIAL HISTORY: Social History   Socioeconomic History   Marital status: Married    Spouse name: Malvika Tung   Number of children: 2   Years of education: 12   Highest education level: High school graduate  Occupational History   Not on file  Tobacco Use   Smoking status: Never   Smokeless tobacco: Never  Vaping Use   Vaping Use: Never used  Substance and Sexual Activity   Alcohol use: Yes    Alcohol/week: 1.0 standard drink of alcohol    Types: 1 Glasses of wine per week   Drug use: Not Currently   Sexual activity: Yes    Birth control/protection: None  Other Topics Concern   Not on file  Social History Narrative   Not on file   Social Determinants of Health   Financial Resource Strain: Not on file  Food Insecurity: Not on file  Transportation Needs: Not on file  Physical Activity: Not on file  Stress: Not on file  Social Connections: Not on file  Intimate Partner Violence: Not on file    FAMILY HISTORY: Family History  Problem Relation Age of Onset   Depression Mother    Vision loss Father    Kidney disease Father    Heart disease Father    Diabetes Father     ALLERGIES:  is allergic to dilaudid [hydromorphone].  MEDICATIONS:  Current Outpatient Medications  Medication Sig Dispense Refill   acetaminophen (TYLENOL) 325 MG tablet Take 2 tablets (650 mg total) by mouth every 6 (six) hours as needed for mild pain (or Fever >/= 101).     CALCIUM PO Take by mouth.     Cholecalciferol (VITAMIN D3 PO) Take by mouth.     Cyanocobalamin (VITAMIN B-12 PO) Take by mouth.     FERROUS SULFATE PO Take by mouth.     fluocinonide cream (LIDEX) 0.05 % Apply 1 application topically 2 (two) times daily. 30 g 5   fluticasone (FLONASE) 50 MCG/ACT nasal spray Place 2 sprays into both nostrils  daily. 16 g 6   rOPINIRole (REQUIP) 3 MG tablet Take 1 tablet (3 mg total) by mouth at bedtime. 90 tablet 1   No current facility-administered medications for this visit.    PHYSICAL EXAMINATION: ECOG PERFORMANCE STATUS: 1 - Symptomatic but completely ambulatory  Vitals:   12/21/21 1609  BP: 121/89  Pulse: 93  Resp: 18  Temp: 98.4 F (36.9 C)  SpO2: 98%   Filed Weights   12/21/21 1609  Weight: 215 lb 4 oz (97.6 kg)    GENERAL:alert, no distress and comfortable SKIN: skin color, texture, turgor are normal, no rashes or significant lesions EYES: normal, conjunctiva are pink and non-injected, sclera clear OROPHARYNX:no exudate, no erythema and lips, buccal mucosa, and tongue normal  NECK: supple, thyroid normal size, non-tender, without nodularity LYMPH:  no palpable lymphadenopathy in the cervical,  axillary or inguinal LUNGS: clear to auscultation and percussion with normal breathing effort HEART: regular rate & rhythm and no murmurs and no lower extremity edema ABDOMEN:abdomen soft, non-tender and normal bowel sounds Musculoskeletal:no cyanosis of digits and no clubbing  PSYCH: alert & oriented x 3 with fluent speech NEURO: no focal motor/sensory deficits  LABORATORY DATA:  I have reviewed the data as listed    Latest Ref Rng & Units 11/19/2021    2:45 PM 08/09/2021    1:11 AM 08/08/2021    5:00 AM  CBC  WBC 3.4 - 10.8 x10E3/uL 9.0  8.1  12.0   Hemoglobin 11.1 - 15.9 g/dL 35.0  09.3  81.8   Hematocrit 34.0 - 46.6 % 32.2  33.1  32.3   Platelets 150 - 450 x10E3/uL 356  306  318        Latest Ref Rng & Units 08/09/2021    1:11 AM 08/08/2021    5:00 AM 08/07/2021    9:52 PM  CMP  Glucose 70 - 99 mg/dL 98  299  371   BUN 6 - 20 mg/dL 10  13  16    Creatinine 0.44 - 1.00 mg/dL  6.96  7.89   Sodium 135 - 145 mmol/L 139  139  138   Potassium 3.5 - 5.1 mmol/L 3.8  3.7  3.7   Chloride 98 - 111 mmol/L 107  106  106   CO2 22 - 32 mmol/L 26  23  24    Calcium 8.9 - 10.3  mg/dL 8.7  8.8  9.0   Total Protein 6.5 - 8.1 g/dL  6.5  7.0   Total Bilirubin 0.3 - 1.2 mg/dL  0.5  0.5   Alkaline Phos 38 - 126 U/L  78  79   AST 15 - 41 U/L  16  20   ALT 0 - 44 U/L  23  26      RADIOGRAPHIC STUDIES: I have personally reviewed the radiological images as listed and agreed with the findings in the report. No results found.  No orders of the defined types were placed in this encounter.   All questions were answered. The patient knows to call the clinic with any problems, questions or concerns. The total time spent in the appointment was 30 minutes.     3.81, MD 12/21/2021   I, Malachy Mood, am acting as scribe for 02/20/2022, MD.   I have reviewed the above documentation for accuracy and completeness, and I agree with the above.

## 2021-12-22 ENCOUNTER — Encounter: Payer: Self-pay | Admitting: Hematology

## 2021-12-24 ENCOUNTER — Other Ambulatory Visit: Payer: Self-pay

## 2021-12-24 ENCOUNTER — Inpatient Hospital Stay: Payer: BC Managed Care – PPO

## 2021-12-24 DIAGNOSIS — D649 Anemia, unspecified: Secondary | ICD-10-CM

## 2021-12-24 DIAGNOSIS — D563 Thalassemia minor: Secondary | ICD-10-CM | POA: Diagnosis not present

## 2021-12-24 DIAGNOSIS — D508 Other iron deficiency anemias: Secondary | ICD-10-CM | POA: Diagnosis not present

## 2021-12-24 LAB — TECHNOLOGIST SMEAR REVIEW

## 2021-12-24 LAB — RETIC PANEL
Immature Retic Fract: 31.4 % — ABNORMAL HIGH (ref 2.3–15.9)
RBC.: 5.19 MIL/uL — ABNORMAL HIGH (ref 3.87–5.11)
Retic Count, Absolute: 109.5 10*3/uL (ref 19.0–186.0)
Retic Ct Pct: 2.1 % (ref 0.4–3.1)
Reticulocyte Hemoglobin: 21.8 pg — ABNORMAL LOW (ref >27.9)

## 2021-12-24 LAB — CBC WITH DIFFERENTIAL (CANCER CENTER ONLY)
Abs Immature Granulocytes: 0.05 10*3/uL (ref 0.00–0.07)
Basophils Absolute: 0.1 10*3/uL (ref 0.0–0.1)
Basophils Relative: 1 %
Eosinophils Absolute: 0 10*3/uL (ref 0.0–0.5)
Eosinophils Relative: 0 %
HCT: 32.4 % — ABNORMAL LOW (ref 36.0–46.0)
Hemoglobin: 10.4 g/dL — ABNORMAL LOW (ref 12.0–15.0)
Immature Granulocytes: 1 %
Lymphocytes Relative: 23 %
Lymphs Abs: 2.2 10*3/uL (ref 0.7–4.0)
MCH: 20.2 pg — ABNORMAL LOW (ref 26.0–34.0)
MCHC: 32.1 g/dL (ref 30.0–36.0)
MCV: 62.8 fL — ABNORMAL LOW (ref 80.0–100.0)
Monocytes Absolute: 0.7 10*3/uL (ref 0.1–1.0)
Monocytes Relative: 7 %
Neutro Abs: 6.6 10*3/uL (ref 1.7–7.7)
Neutrophils Relative %: 68 %
Platelet Count: 352 10*3/uL (ref 150–400)
RBC: 5.16 MIL/uL — ABNORMAL HIGH (ref 3.87–5.11)
RDW: 16.2 % — ABNORMAL HIGH (ref 11.5–15.5)
WBC Count: 9.7 10*3/uL (ref 4.0–10.5)
nRBC: 0 % (ref 0.0–0.2)

## 2021-12-24 LAB — TSH: TSH: 2.362 u[IU]/mL (ref 0.350–4.500)

## 2021-12-24 LAB — SEDIMENTATION RATE: Sed Rate: 20 mm/hr (ref 0–22)

## 2021-12-24 LAB — T4, FREE: Free T4: 0.83 ng/dL (ref 0.61–1.12)

## 2021-12-24 LAB — VITAMIN D 25 HYDROXY (VIT D DEFICIENCY, FRACTURES): Vit D, 25-Hydroxy: 38.33 ng/mL (ref 30–100)

## 2021-12-24 LAB — IRON AND IRON BINDING CAPACITY (CC-WL,HP ONLY)
Iron: 74 ug/dL (ref 28–170)
Saturation Ratios: 22 % (ref 10.4–31.8)
TIBC: 337 ug/dL (ref 250–450)
UIBC: 263 ug/dL (ref 148–442)

## 2021-12-24 LAB — FERRITIN: Ferritin: 77 ng/mL (ref 11–307)

## 2021-12-25 LAB — SOLUBLE TRANSFERRIN RECEPTOR: Transferrin Receptor: 35.4 nmol/L — ABNORMAL HIGH (ref 12.2–27.3)

## 2021-12-26 LAB — FOLATE RBC
Folate, Hemolysate: 340 ng/mL
Folate, RBC: 977 ng/mL (ref >498)
Hematocrit: 34.8 % (ref 34.0–46.6)

## 2021-12-26 LAB — HEAVY METALS, BLOOD
Arsenic: <1 ug/L (ref 0–9)
Lead: 1.6 ug/dL (ref 0.0–3.4)
Mercury: <1 ug/L (ref 0.0–14.9)

## 2021-12-26 LAB — METHYLMALONIC ACID, SERUM: Methylmalonic Acid, Quantitative: 77 nmol/L (ref 0–378)

## 2021-12-28 LAB — COPPER, SERUM: Copper: 137 ug/dL (ref 80–158)

## 2022-01-07 ENCOUNTER — Other Ambulatory Visit: Payer: Self-pay | Admitting: Hematology

## 2022-01-07 DIAGNOSIS — D649 Anemia, unspecified: Secondary | ICD-10-CM

## 2022-01-08 ENCOUNTER — Telehealth: Payer: Self-pay | Admitting: Hematology

## 2022-01-08 NOTE — Telephone Encounter (Signed)
Contacted patient to scheduled appointments. Patient is aware of appointments that are scheduled.   

## 2022-01-20 ENCOUNTER — Telehealth: Payer: BC Managed Care – PPO

## 2022-01-20 DIAGNOSIS — R059 Cough, unspecified: Secondary | ICD-10-CM | POA: Diagnosis not present

## 2022-01-20 DIAGNOSIS — Z20822 Contact with and (suspected) exposure to covid-19: Secondary | ICD-10-CM | POA: Diagnosis not present

## 2022-01-20 DIAGNOSIS — J069 Acute upper respiratory infection, unspecified: Secondary | ICD-10-CM | POA: Diagnosis not present

## 2022-01-20 DIAGNOSIS — J111 Influenza due to unidentified influenza virus with other respiratory manifestations: Secondary | ICD-10-CM | POA: Diagnosis not present

## 2022-01-20 DIAGNOSIS — R509 Fever, unspecified: Secondary | ICD-10-CM | POA: Diagnosis not present

## 2022-01-20 NOTE — Progress Notes (Unsigned)
The patient no-showed for appointment despite this provider sending direct link, reaching out via phone with no response and waiting for at least 10 minutes from appointment time for patient to join. They will be marked as a NS for this appointment/time.  

## 2022-01-20 NOTE — Progress Notes (Deleted)
Virtual Visit Consent   Amanda Clarke, you are scheduled for a virtual visit with a [facility] provider today. Just as with appointments in the office, your consent must be obtained to participate. Your consent will be active for this visit and any virtual visit you may have with one of our providers in the next 365 days. If you have a MyChart account, a copy of this consent can be sent to you electronically.  As this is a virtual visit, video technology does not allow for your provider to perform a traditional examination. This may limit your provider's ability to fully assess your condition. If your provider identifies any concerns that need to be evaluated in person or the need to arrange testing (such as labs, EKG, etc.), we will make arrangements to do so. Although advances in technology are sophisticated, we cannot ensure that it will always work on either your end or our end. If the connection with a video visit is poor, the visit may have to be switched to a telephone visit. With either a video or telephone visit, we are not always able to ensure that we have a secure connection.  By engaging in this virtual visit, you consent to the provision of healthcare and authorize for your insurance to be billed (if applicable) for the services provided during this visit. Depending on your insurance coverage, you may receive a charge related to this service.  I need to obtain your verbal consent now. Are you willing to proceed with your visit today? Amanda Clarke has provided verbal consent on 01/20/2022 for a virtual visit (video or telephone). Amanda Clarke, [place]  Date: 01/20/2022 12:16 PM  Virtual Visit via Video Note   I, Amanda Clarke, connected with  SHAYMA PFEFFERLE  ([id number], [birth year]) on 01/20/22 at 12:15 PM EDT by a video-enabled telemedicine application and verified that I am speaking with the correct person using two identifiers.  Location: Patient: {Virtual Visit Location  Patient:[id number]::"Home"} Provider: {Virtual Visit Location Provider:[id number]::"Office/Clinic"}   I discussed the limitations of evaluation and management by telemedicine and the availability of in person appointments. The patient expressed understanding and agreed to proceed.    History of Present Illness: Amanda Clarke is a 41 y.o. who identifies as a female who was assigned female at birth, and is being seen today for ***.  HPI: HPI  Problems:  Patient Active Problem List   Diagnosis Date Noted   Intractable abdominal pain 08/08/2021   Restless leg syndrome 04/16/2021   Generalized anxiety disorder 04/16/2021   Depression, major, single episode, moderate (HCC) 04/16/2021   Class 2 obesity due to excess calories without serious comorbidity with body mass index (BMI) of 35.0 to 35.9 in adult 04/16/2021   Thalassemia trait 04/16/2021   Vitamin B12 deficiency 04/16/2021   Vitamin D deficiency 04/16/2021    Allergies:  Allergies  Allergen Reactions   Dilaudid [Hydromorphone] Itching   Medications:  Current Outpatient Medications:    acetaminophen (TYLENOL) 325 MG tablet, Take 2 tablets (650 mg total) by mouth every 6 (six) hours as needed for mild pain (or Fever >/= 101)., Disp: , Rfl:    CALCIUM PO, Take by mouth., Disp: , Rfl:    Cholecalciferol (VITAMIN D3 PO), Take by mouth., Disp: , Rfl:    Cyanocobalamin (VITAMIN B-12 PO), Take by mouth., Disp: , Rfl:    FERROUS SULFATE PO, Take by mouth., Disp: , Rfl:    fluocinonide cream (LIDEX) 0.05 %, Apply 1 application topically  2 (two) times daily., Disp: 30 g, Rfl: 5   fluticasone (FLONASE) 50 MCG/ACT nasal spray, Place 2 sprays into both nostrils daily., Disp: 16 g, Rfl: 6   rOPINIRole (REQUIP) 3 MG tablet, Take 1 tablet (3 mg total) by mouth at bedtime., Disp: 90 tablet, Rfl: 1  Observations/Objective: Patient is well-developed, well-nourished in no acute distress.  Resting comfortably *** at home.  Head is normocephalic,  atraumatic.  No labored breathing. *** Speech is clear and coherent with logical content.  Patient is alert and oriented at baseline.  ***  Assessment and Plan: There are no diagnoses linked to this encounter. ***  Follow Up Instructions: I discussed the assessment and treatment plan with the patient. The patient was provided an opportunity to ask questions and all were answered. The patient agreed with the plan and demonstrated an understanding of the instructions.  A copy of instructions were sent to the patient via MyChart unless otherwise noted below.   {EMAIL AVS:[id number]::"Patient has requested to receive PHI (AVS, Work Notes, etc) pertaining to this video visit through e-mail as they are currently without active MyChart. They have voiced understand that email is not considered secure and their health information could be viewed by someone other than the patient. "}  The patient was advised to call back or seek an in-person evaluation if the symptoms worsen or if the condition fails to improve as anticipated.  Time:  I spent *** minutes with the patient via telehealth technology discussing the above problems/concerns.    Amanda Clarke, [place]

## 2022-02-01 ENCOUNTER — Telehealth: Payer: BC Managed Care – PPO | Admitting: Physician Assistant

## 2022-02-01 DIAGNOSIS — J208 Acute bronchitis due to other specified organisms: Secondary | ICD-10-CM | POA: Diagnosis not present

## 2022-02-01 DIAGNOSIS — B9689 Other specified bacterial agents as the cause of diseases classified elsewhere: Secondary | ICD-10-CM | POA: Diagnosis not present

## 2022-02-01 MED ORDER — AZITHROMYCIN 250 MG PO TABS: ORAL_TABLET | ORAL | 0 refills | Status: AC

## 2022-02-01 MED ORDER — PROMETHAZINE-DM 6.25-15 MG/5ML PO SYRP: 5.0000 mL | ORAL_SOLUTION | Freq: Four times a day (QID) | ORAL | 0 refills | Status: DC | PRN

## 2022-02-01 MED ORDER — PREDNISONE 20 MG PO TABS: 40.0000 mg | ORAL_TABLET | Freq: Every day | ORAL | 0 refills | Status: DC

## 2022-02-01 MED ORDER — BENZONATATE 100 MG PO CAPS: 100.0000 mg | ORAL_CAPSULE | Freq: Three times a day (TID) | ORAL | 0 refills | Status: DC | PRN

## 2022-02-01 MED ORDER — ALBUTEROL SULFATE HFA 108 (90 BASE) MCG/ACT IN AERS: 1.0000 | INHALATION_SPRAY | Freq: Four times a day (QID) | RESPIRATORY_TRACT | 0 refills | Status: DC | PRN

## 2022-02-01 NOTE — Progress Notes (Signed)
Virtual Visit Consent   Amanda Clarke, you are scheduled for a virtual visit with a [facility] provider today. Just as with appointments in the office, your consent must be obtained to participate. Your consent will be active for this visit and any virtual visit you may have with one of our providers in the next 365 days. If you have a MyChart account, a copy of this consent can be sent to you electronically.  As this is a virtual visit, video technology does not allow for your provider to perform a traditional examination. This may limit your provider's ability to fully assess your condition. If your provider identifies any concerns that need to be evaluated in person or the need to arrange testing (such as labs, EKG, etc.), we will make arrangements to do so. Although advances in technology are sophisticated, we cannot ensure that it will always work on either your end or our end. If the connection with a video visit is poor, the visit may have to be switched to a telephone visit. With either a video or telephone visit, we are not always able to ensure that we have a secure connection.  By engaging in this virtual visit, you consent to the provision of healthcare and authorize for your insurance to be billed (if applicable) for the services provided during this visit. Depending on your insurance coverage, you may receive a charge related to this service.  I need to obtain your verbal consent now. Are you willing to proceed with your visit today? Amanda Clarke has provided verbal consent on 02/01/2022 for a virtual visit (video or telephone). Mar Daring, PA-C  Date: 02/01/2022 3:42 PM  Virtual Visit via Video Note   I, Mar Daring, connected with  Amanda Clarke  ([id number], 05-06-1981) on 02/01/22 at  3:30 PM EDT by a video-enabled telemedicine application and verified that I am speaking with the correct person using two identifiers.  Location: Patient: Virtual Visit Location  Patient: Home Provider: Virtual Visit Location Provider: Home Office   I discussed the limitations of evaluation and management by telemedicine and the availability of in person appointments. The patient expressed understanding and agreed to proceed.    History of Present Illness: Amanda Clarke is a 41 y.o. who identifies as a female who was assigned female at birth, and is being seen today for cough.  HPI: Cough This is a new problem. The current episode started 1 to 4 weeks ago. The problem has been gradually worsening. The cough is Non-productive. Associated symptoms include chest pain, myalgias (with cough), shortness of breath (mild) and wheezing (at night lying down and also having rattling). Pertinent negatives include no chills, ear congestion, ear pain, fever, nasal congestion, postnasal drip, rhinorrhea or sore throat. The symptoms are aggravated by lying down. Risk factors for lung disease include travel. She has tried nothing (tamiflu, dayquil, nyquil) for the symptoms. The treatment provided no relief. There is no history of asthma, bronchitis or pneumonia.   Tested positive for influenza A on 01/19/22 in [place] and treated with Tamiflu.    Problems:  Patient Active Problem List   Diagnosis Date Noted   Intractable abdominal pain 08/08/2021   Restless leg syndrome 04/16/2021   Generalized anxiety disorder 04/16/2021   Depression, major, single episode, moderate ([place]) 04/16/2021   Class 2 obesity due to excess calories without serious comorbidity with body mass index (BMI) of 35.0 to 35.9 in adult 04/16/2021   Thalassemia trait 04/16/2021  Vitamin B12 deficiency 04/16/2021   Vitamin D deficiency 04/16/2021    Allergies:  Allergies  Allergen Reactions   Dilaudid [Hydromorphone] Itching   Medications:  Current Outpatient Medications:    albuterol (VENTOLIN HFA) 108 (90 Base) MCG/ACT inhaler, Inhale 1-2 puffs into the lungs every 6 (six) hours as needed for wheezing or  shortness of breath., Disp: 8 g, Rfl: 0   azithromycin (ZITHROMAX) 250 MG tablet, Take 2 tablets on day 1, then 1 tablet daily on days 2 through 5, Disp: 6 tablet, Rfl: 0   benzonatate (TESSALON) 100 MG capsule, Take 1 capsule (100 mg total) by mouth 3 (three) times daily as needed., Disp: 30 capsule, Rfl: 0   predniSONE (DELTASONE) 20 MG tablet, Take 2 tablets (40 mg total) by mouth daily with breakfast., Disp: 10 tablet, Rfl: 0   promethazine-dextromethorphan (PROMETHAZINE-DM) 6.25-15 MG/5ML syrup, Take 5 mLs by mouth 4 (four) times daily as needed., Disp: 118 mL, Rfl: 0   acetaminophen (TYLENOL) 325 MG tablet, Take 2 tablets (650 mg total) by mouth every 6 (six) hours as needed for mild pain (or Fever >/= 101)., Disp: , Rfl:    CALCIUM PO, Take by mouth., Disp: , Rfl:    Cholecalciferol (VITAMIN D3 PO), Take by mouth., Disp: , Rfl:    Cyanocobalamin (VITAMIN B-12 PO), Take by mouth., Disp: , Rfl:    FERROUS SULFATE PO, Take by mouth., Disp: , Rfl:    fluocinonide cream (LIDEX) 0.05 %, Apply 1 application topically 2 (two) times daily., Disp: 30 g, Rfl: 5   fluticasone (FLONASE) 50 MCG/ACT nasal spray, Place 2 sprays into both nostrils daily., Disp: 16 g, Rfl: 6   rOPINIRole (REQUIP) 3 MG tablet, Take 1 tablet (3 mg total) by mouth at bedtime., Disp: 90 tablet, Rfl: 1  Observations/Objective: Patient is well-developed, well-nourished in no acute distress.  Resting comfortably at home.  Head is normocephalic, atraumatic.  No labored breathing.  Speech is clear and coherent with logical content.  Patient is alert and oriented at baseline.  Dry, barking cough heard frequently  Assessment and Plan: 1. Acute bacterial bronchitis - azithromycin (ZITHROMAX) 250 MG tablet; Take 2 tablets on day 1, then 1 tablet daily on days 2 through 5  Dispense: 6 tablet; Refill: 0 - predniSONE (DELTASONE) 20 MG tablet; Take 2 tablets (40 mg total) by mouth daily with breakfast.  Dispense: 10 tablet; Refill:  0 - albuterol (VENTOLIN HFA) 108 (90 Base) MCG/ACT inhaler; Inhale 1-2 puffs into the lungs every 6 (six) hours as needed for wheezing or shortness of breath.  Dispense: 8 g; Refill: 0 - benzonatate (TESSALON) 100 MG capsule; Take 1 capsule (100 mg total) by mouth 3 (three) times daily as needed.  Dispense: 30 capsule; Refill: 0 - promethazine-dextromethorphan (PROMETHAZINE-DM) 6.25-15 MG/5ML syrup; Take 5 mLs by mouth 4 (four) times daily as needed.  Dispense: 118 mL; Refill: 0  - Worsening over a week despite OTC medications - Will treat with Z-pack, prednisone, albuterol, Promethazine DM, and tessalon perles - Can continue Mucinex  - Push fluids.  - Rest.  - Steam and humidifier can help - Seek in person evaluation if worsening or symptoms fail to improve    Follow Up Instructions: I discussed the assessment and treatment plan with the patient. The patient was provided an opportunity to ask questions and all were answered. The patient agreed with the plan and demonstrated an understanding of the instructions.  A copy of instructions were sent to the patient via  MyChart unless otherwise noted below.    The patient was advised to call back or seek an in-person evaluation if the symptoms worsen or if the condition fails to improve as anticipated.  Time:  I spent 11 minutes with the patient via telehealth technology discussing the above problems/concerns.    Margaretann Loveless, PA-C

## 2022-02-01 NOTE — Patient Instructions (Signed)
Amanda Clarke, thank you for joining Mar Daring, PA-C for today's virtual visit.  While this provider is not your primary care provider (PCP), if your PCP is located in our provider database this encounter information will be shared with them immediately following your visit.  Consent: (Patient) Amanda Clarke provided verbal consent for this virtual visit at the beginning of the encounter.  Current Medications:  Current Outpatient Medications:    albuterol (VENTOLIN HFA) 108 (90 Base) MCG/ACT inhaler, Inhale 1-2 puffs into the lungs every 6 (six) hours as needed for wheezing or shortness of breath., Disp: 8 g, Rfl: 0   azithromycin (ZITHROMAX) 250 MG tablet, Take 2 tablets on day 1, then 1 tablet daily on days 2 through 5, Disp: 6 tablet, Rfl: 0   benzonatate (TESSALON) 100 MG capsule, Take 1 capsule (100 mg total) by mouth 3 (three) times daily as needed., Disp: 30 capsule, Rfl: 0   predniSONE (DELTASONE) 20 MG tablet, Take 2 tablets (40 mg total) by mouth daily with breakfast., Disp: 10 tablet, Rfl: 0   promethazine-dextromethorphan (PROMETHAZINE-DM) 6.25-15 MG/5ML syrup, Take 5 mLs by mouth 4 (four) times daily as needed., Disp: 118 mL, Rfl: 0   acetaminophen (TYLENOL) 325 MG tablet, Take 2 tablets (650 mg total) by mouth every 6 (six) hours as needed for mild pain (or Fever >/= 101)., Disp: , Rfl:    CALCIUM PO, Take by mouth., Disp: , Rfl:    Cholecalciferol (VITAMIN D3 PO), Take by mouth., Disp: , Rfl:    Cyanocobalamin (VITAMIN B-12 PO), Take by mouth., Disp: , Rfl:    FERROUS SULFATE PO, Take by mouth., Disp: , Rfl:    fluocinonide cream (LIDEX) 7.74 %, Apply 1 application topically 2 (two) times daily., Disp: 30 g, Rfl: 5   fluticasone (FLONASE) 50 MCG/ACT nasal spray, Place 2 sprays into both nostrils daily., Disp: 16 g, Rfl: 6   rOPINIRole (REQUIP) 3 MG tablet, Take 1 tablet (3 mg total) by mouth at bedtime., Disp: 90 tablet, Rfl: 1   Medications ordered in this  encounter:  Meds ordered this encounter  Medications   azithromycin (ZITHROMAX) 250 MG tablet    Sig: Take 2 tablets on day 1, then 1 tablet daily on days 2 through 5    Dispense:  6 tablet    Refill:  0    Order Specific Question:   Supervising Provider    Answer:   Chase Picket [[id number]]   predniSONE (DELTASONE) 20 MG tablet    Sig: Take 2 tablets (40 mg total) by mouth daily with breakfast.    Dispense:  10 tablet    Refill:  0    Order Specific Question:   Supervising Provider    Answer:   Chase Picket [[id number]]   albuterol (VENTOLIN HFA) 108 (90 Base) MCG/ACT inhaler    Sig: Inhale 1-2 puffs into the lungs every 6 (six) hours as needed for wheezing or shortness of breath.    Dispense:  8 g    Refill:  0    Order Specific Question:   Supervising Provider    Answer:   Chase Picket [[id number]]   benzonatate (TESSALON) 100 MG capsule    Sig: Take 1 capsule (100 mg total) by mouth 3 (three) times daily as needed.    Dispense:  30 capsule    Refill:  0    Order Specific Question:   Supervising Provider    Answer:   Chase Picket [  [id number]]   promethazine-dextromethorphan (PROMETHAZINE-DM) 6.25-15 MG/5ML syrup    Sig: Take 5 mLs by mouth 4 (four) times daily as needed.    Dispense:  118 mL    Refill:  0    Order Specific Question:   Supervising Provider    Answer:   Merrilee Jansky [[id number]]     *If you need refills on other medications prior to your next appointment, please contact your pharmacy*  Follow-Up: Call back or seek an in-person evaluation if the symptoms worsen or if the condition fails to improve as anticipated.  [facility] [phone]  Other Instructions Acute Bronchitis, Adult  Acute bronchitis is sudden inflammation of the main airways (bronchi) that come off the windpipe (trachea) in the lungs. The swelling causes the airways to get smaller and make more mucus than normal. This can make it hard to breathe and can cause  coughing or noisy breathing (wheezing). Acute bronchitis may last several weeks. The cough may last longer. Allergies, asthma, and exposure to smoke may make the condition worse. What are the causes? This condition can be caused by germs and by substances that irritate the lungs, including: Cold and flu viruses. The most common cause of this condition is the virus that causes the common cold. Bacteria. This is less common. Breathing in substances that irritate the lungs, including: Smoke from cigarettes and other forms of tobacco. Dust and pollen. Fumes from household cleaning products, gases, or burned fuel. Indoor or outdoor air pollution. What increases the risk? The following factors may make you more likely to develop this condition: A weak body's defense system, also called the immune system. A condition that affects your lungs and breathing, such as asthma. What are the signs or symptoms? Common symptoms of this condition include: Coughing. This may bring up clear, yellow, or green mucus from your lungs (sputum). Wheezing. Runny or stuffy nose. Having too much mucus in your lungs (chest congestion). Shortness of breath. Aches and pains, including sore throat or chest. How is this diagnosed? This condition is usually diagnosed based on: Your symptoms and medical history. A physical exam. You may also have other tests, including tests to rule out other conditions, such as pneumonia. These tests include: A test of lung function. Test of a mucus sample to look for the presence of bacteria. Tests to check the oxygen level in your blood. Blood tests. Chest X-ray. How is this treated? Most cases of acute bronchitis clear up over time without treatment. Your health care provider may recommend: Drinking more fluids to help thin your mucus so it is easier to cough up. Taking inhaled medicine (inhaler) to improve air flow in and out of your lungs. Using a vaporizer or a humidifier.  These are machines that add water to the air to help you breathe better. Taking a medicine that thins mucus and clears congestion (expectorant). Taking a medicine that prevents or stops coughing (cough suppressant). It is not common to take an antibiotic medicine for this condition. Follow these instructions at home:  Take over-the-counter and prescription medicines only as told by your health care provider. Use an inhaler, vaporizer, or humidifier as told by your health care provider. Take two teaspoons (10 mL) of honey at bedtime to lessen coughing at night. Drink enough fluid to keep your urine pale yellow. Do not use any products that contain nicotine or tobacco. These products include cigarettes, chewing tobacco, and vaping devices, such as e-cigarettes. If you need help quitting,  ask your health care provider. Get plenty of rest. Return to your normal activities as told by your health care provider. Ask your health care provider what activities are safe for you. Keep all follow-up visits. This is important. How is this prevented? To lower your risk of getting this condition again: Wash your hands often with soap and water for at least 20 seconds. If soap and water are not available, use hand sanitizer. Avoid contact with people who have cold symptoms. Try not to touch your mouth, nose, or eyes with your hands. Avoid breathing in smoke or chemical fumes. Breathing smoke or chemical fumes will make your condition worse. Get the flu shot every year. Contact a health care provider if: Your symptoms do not improve after 2 weeks. You have trouble coughing up the mucus. Your cough keeps you awake at night. You have a fever. Get help right away if you: Cough up blood. Feel pain in your chest. Have severe shortness of breath. Faint or keep feeling like you are going to faint. Have a severe headache. Have a fever or chills that get worse. These symptoms may represent a serious problem  that is an emergency. Do not wait to see if the symptoms will go away. Get medical help right away. Call your local emergency services (911 in the [place] Do not drive yourself to the hospital. Summary Acute bronchitis is inflammation of the main airways (bronchi) that come off the windpipe (trachea) in the lungs. The swelling causes the airways to get smaller and make more mucus than normal. Drinking more fluids can help thin your mucus so it is easier to cough up. Take over-the-counter and prescription medicines only as told by your health care provider. Do not use any products that contain nicotine or tobacco. These products include cigarettes, chewing tobacco, and vaping devices, such as e-cigarettes. If you need help quitting, ask your health care provider. Contact a health care provider if your symptoms do not improve after 2 weeks. This information is not intended to replace advice given to you by your health care provider. Make sure you discuss any questions you have with your health care provider. Document Revised: 08/09/2021 Document Reviewed: 08/30/2020 Elsevier Patient Education  2023 Elsevier Inc.    If you have been instructed to have an in-person evaluation today at a local Urgent Care facility, please use the link below. It will take you to a list of all of our available [facility], including address, phone number and hours of operation. Please do not delay care.  [facility]  If you or a family member do not have a primary care provider, use the link below to schedule a visit and establish care. When you choose a [facility] primary care physician or advanced practice provider, you gain a long-term partner in health. Find a Primary Care Provider  Learn more about [facility] in-office and virtual care options: [facility] - Get Care Now

## 2022-02-03 ENCOUNTER — Other Ambulatory Visit: Payer: Self-pay | Admitting: Family Medicine

## 2022-02-03 DIAGNOSIS — G2581 Restless legs syndrome: Secondary | ICD-10-CM

## 2022-02-09 ENCOUNTER — Telehealth: Payer: BC Managed Care – PPO | Admitting: Family

## 2022-02-09 DIAGNOSIS — J209 Acute bronchitis, unspecified: Secondary | ICD-10-CM

## 2022-02-09 MED ORDER — PREDNISONE 10 MG (21) PO TBPK: ORAL_TABLET | ORAL | 0 refills | Status: DC

## 2022-02-09 NOTE — Progress Notes (Signed)
Virtual Visit Consent   [name], you are scheduled for a virtual visit with a [facility] provider today. Just as with appointments in the office, your consent must be obtained to participate. Your consent will be active for this visit and any virtual visit you may have with one of our providers in the next 365 days. If you have a MyChart account, a copy of this consent can be sent to you electronically.  As this is a virtual visit, video technology does not allow for your provider to perform a traditional examination. This may limit your provider's ability to fully assess your condition. If your provider identifies any concerns that need to be evaluated in person or the need to arrange testing (such as labs, EKG, etc.), we will make arrangements to do so. Although advances in technology are sophisticated, we cannot ensure that it will always work on either your end or our end. If the connection with a video visit is poor, the visit may have to be switched to a telephone visit. With either a video or telephone visit, we are not always able to ensure that we have a secure connection.  By engaging in this virtual visit, you consent to the provision of healthcare and authorize for your insurance to be billed (if applicable) for the services provided during this visit. Depending on your insurance coverage, you may receive a charge related to this service.  I need to obtain your verbal consent now. Are you willing to proceed with your visit today? [name] has provided verbal consent on 02/09/2022 for a virtual visit (video or telephone). Evelina Dun, FNP  Date: 02/09/2022 12:05 PM  Virtual Visit via Video Note   I, Evelina Dun, connected with  Amanda Clarke  ([id number], [birth year]) on 02/09/22 at 12:00 PM EDT by a video-enabled telemedicine application and verified that I am speaking with the correct person using two identifiers.  Location: Patient: Virtual Visit Location Patient:  Home Provider: Virtual Visit Location Provider: Home Office   I discussed the limitations of evaluation and management by telemedicine and the availability of in person appointments. The patient expressed understanding and agreed to proceed.    History of Present Illness: Amanda Clarke is a 41 y.o. who identifies as a female who was assigned female at birth, and is being seen today for cough. She had a video visit on 02/01/22 and was given Zpak, prednisone 40 mg for 5 days, tessalon, albuterol inhaler, and cough medications. Reports she was feeling better, but once she completed her antibiotics and steroids her symptoms returned.   She had Flu A on 01/20/22.  HPI: Cough This is a recurrent problem. The current episode started 1 to 4 weeks ago. The problem occurs every few minutes. The cough is Non-productive. Associated symptoms include chills, headaches, shortness of breath and wheezing. Pertinent negatives include no ear congestion, ear pain, fever, myalgias, nasal congestion or postnasal drip. She has tried rest and prescription cough suppressant for the symptoms. The treatment provided mild relief. There is no history of asthma or COPD.    Problems:  Patient Active Problem List   Diagnosis Date Noted   Intractable abdominal pain 08/08/2021   Restless leg syndrome 04/16/2021   Generalized anxiety disorder 04/16/2021   Depression, major, single episode, moderate ([place]) 04/16/2021   Class 2 obesity due to excess calories without serious comorbidity with body mass index (BMI) of 35.0 to 35.9 in adult 04/16/2021   Thalassemia trait 04/16/2021  Vitamin B12 deficiency 04/16/2021   Vitamin D deficiency 04/16/2021    Allergies:  Allergies  Allergen Reactions   Dilaudid [Hydromorphone] Itching   Medications:  Current Outpatient Medications:    predniSONE (STERAPRED UNI-PAK 21 TAB) 10 MG (21) TBPK tablet, Use as directed, Disp: 21 tablet, Rfl: 0   acetaminophen (TYLENOL) 325 MG tablet,  Take 2 tablets (650 mg total) by mouth every 6 (six) hours as needed for mild pain (or Fever >/= 101)., Disp: , Rfl:    albuterol (VENTOLIN HFA) 108 (90 Base) MCG/ACT inhaler, Inhale 1-2 puffs into the lungs every 6 (six) hours as needed for wheezing or shortness of breath., Disp: 8 g, Rfl: 0   benzonatate (TESSALON) 100 MG capsule, Take 1 capsule (100 mg total) by mouth 3 (three) times daily as needed., Disp: 30 capsule, Rfl: 0   CALCIUM PO, Take by mouth., Disp: , Rfl:    Cholecalciferol (VITAMIN D3 PO), Take by mouth., Disp: , Rfl:    Cyanocobalamin (VITAMIN B-12 PO), Take by mouth., Disp: , Rfl:    FERROUS SULFATE PO, Take by mouth., Disp: , Rfl:    fluocinonide cream (LIDEX) 0.05 %, Apply 1 application topically 2 (two) times daily., Disp: 30 g, Rfl: 5   fluticasone (FLONASE) 50 MCG/ACT nasal spray, Place 2 sprays into both nostrils daily., Disp: 16 g, Rfl: 6   promethazine-dextromethorphan (PROMETHAZINE-DM) 6.25-15 MG/5ML syrup, Take 5 mLs by mouth 4 (four) times daily as needed., Disp: 118 mL, Rfl: 0   rOPINIRole (REQUIP) 3 MG tablet, Take 1 tablet (3 mg total) by mouth at bedtime., Disp: 90 tablet, Rfl: 1  Observations/Objective: Patient is well-developed, well-nourished in no acute distress.  Resting comfortably  at home.  Head is normocephalic, atraumatic.  No labored breathing.  Speech is clear and coherent with logical content.  Patient is alert and oriented at baseline.    Assessment and Plan: 1. Acute bronchitis, unspecified organism - predniSONE (STERAPRED UNI-PAK 21 TAB) 10 MG (21) TBPK tablet; Use as directed  Dispense: 21 tablet; Refill: 0  - Take meds as prescribed - Use a cool mist humidifier  -Use saline nose sprays frequently -Force fluids -For any cough or congestion  Use plain Mucinex- regular strength or max strength is fine -For fever or aces or pains- take tylenol or ibuprofen. Will give another steroids since having chest tightness and wheezing She will  call PCP office on Monday and have chest x-ray to rule out pneumonia  Follow Up Instructions: I discussed the assessment and treatment plan with the patient. The patient was provided an opportunity to ask questions and all were answered. The patient agreed with the plan and demonstrated an understanding of the instructions.  A copy of instructions were sent to the patient via MyChart unless otherwise noted below.     The patient was advised to call back or seek an in-person evaluation if the symptoms worsen or if the condition fails to improve as anticipated.  Time:  I spent 11 minutes with the patient via telehealth technology discussing the above problems/concerns.    Jannifer Rodney, FNP

## 2022-02-09 NOTE — Patient Instructions (Signed)

## 2022-02-15 DIAGNOSIS — R0789 Other chest pain: Secondary | ICD-10-CM | POA: Diagnosis not present

## 2022-02-15 DIAGNOSIS — R079 Chest pain, unspecified: Secondary | ICD-10-CM | POA: Diagnosis not present

## 2022-02-15 DIAGNOSIS — D649 Anemia, unspecified: Secondary | ICD-10-CM | POA: Diagnosis not present

## 2022-02-15 DIAGNOSIS — R0609 Other forms of dyspnea: Secondary | ICD-10-CM | POA: Diagnosis not present

## 2022-02-15 DIAGNOSIS — Z79899 Other long term (current) drug therapy: Secondary | ICD-10-CM | POA: Diagnosis not present

## 2022-02-15 DIAGNOSIS — Z20822 Contact with and (suspected) exposure to covid-19: Secondary | ICD-10-CM | POA: Diagnosis not present

## 2022-02-15 DIAGNOSIS — Z793 Long term (current) use of hormonal contraceptives: Secondary | ICD-10-CM | POA: Diagnosis not present

## 2022-02-15 DIAGNOSIS — B009 Herpesviral infection, unspecified: Secondary | ICD-10-CM | POA: Diagnosis not present

## 2022-02-15 DIAGNOSIS — R000 Tachycardia, unspecified: Secondary | ICD-10-CM | POA: Diagnosis not present

## 2022-02-15 DIAGNOSIS — R6510 Systemic inflammatory response syndrome (SIRS) of non-infectious origin without acute organ dysfunction: Secondary | ICD-10-CM | POA: Diagnosis not present

## 2022-02-15 DIAGNOSIS — D72829 Elevated white blood cell count, unspecified: Secondary | ICD-10-CM | POA: Diagnosis not present

## 2022-02-15 DIAGNOSIS — A419 Sepsis, unspecified organism: Secondary | ICD-10-CM | POA: Diagnosis not present

## 2022-02-15 DIAGNOSIS — R0602 Shortness of breath: Secondary | ICD-10-CM | POA: Diagnosis not present

## 2022-02-15 DIAGNOSIS — Z23 Encounter for immunization: Secondary | ICD-10-CM | POA: Diagnosis not present

## 2022-02-15 DIAGNOSIS — Z87442 Personal history of urinary calculi: Secondary | ICD-10-CM | POA: Diagnosis not present

## 2022-02-16 DIAGNOSIS — R6510 Systemic inflammatory response syndrome (SIRS) of non-infectious origin without acute organ dysfunction: Secondary | ICD-10-CM | POA: Diagnosis not present

## 2022-02-16 DIAGNOSIS — R0609 Other forms of dyspnea: Secondary | ICD-10-CM | POA: Diagnosis not present

## 2022-02-16 DIAGNOSIS — R0789 Other chest pain: Secondary | ICD-10-CM | POA: Diagnosis not present

## 2022-02-17 DIAGNOSIS — R0789 Other chest pain: Secondary | ICD-10-CM | POA: Diagnosis not present

## 2022-02-19 ENCOUNTER — Ambulatory Visit (INDEPENDENT_AMBULATORY_CARE_PROVIDER_SITE_OTHER): Payer: BC Managed Care – PPO | Admitting: Family Medicine

## 2022-02-19 DIAGNOSIS — B3731 Acute candidiasis of vulva and vagina: Secondary | ICD-10-CM | POA: Diagnosis not present

## 2022-02-19 MED ORDER — FLUCONAZOLE 150 MG PO TABS: 150.0000 mg | ORAL_TABLET | Freq: Once | ORAL | 0 refills | Status: AC

## 2022-02-19 NOTE — Progress Notes (Signed)
Telephone visit  Subjective: CC: yeast vaginitis PCP: Gwenlyn Perking, FNP Amanda Clarke is a 41 y.o. female calls for telephone consult today. Patient provides verbal consent for consult held via phone.  Due to COVID-19 pandemic this visit was conducted virtually. This visit type was conducted due to national recommendations for restrictions regarding the COVID-19 Pandemic (e.g. social distancing, sheltering in place) in an effort to limit this patient's exposure and mitigate transmission in our community. All issues noted in this document were discussed and addressed.  A physical exam was not performed with this format.   Location of patient: home Location of provider: [facility] Others present for call: none  1.  Yeast vaginitis She was on vacation at the beach, she had a panic attack and was placed on antibiotics due to elevation in white blood cell count.  It turns out that the white blood cell count was related to the recent use of prednisone for bronchitis the week prior.  He, she has now developed a severe yeast infection. She reports vaginal itching, discharge. LMP 02/11/2022.  No treatments thus far but she does not feel that she could wait until her follow-up visit on Friday with her PCP   ROS: Per HPI  Allergies  Allergen Reactions   Dilaudid [Hydromorphone] Itching   Past Medical History:  Diagnosis Date   Anemia    Anxiety    Heart murmur    IBS (irritable bowel syndrome)    Recurrent kidney stones     Current Outpatient Medications:    acetaminophen (TYLENOL) 325 MG tablet, Take 2 tablets (650 mg total) by mouth every 6 (six) hours as needed for mild pain (or Fever >/= 101)., Disp: , Rfl:    albuterol (VENTOLIN HFA) 108 (90 Base) MCG/ACT inhaler, Inhale 1-2 puffs into the lungs every 6 (six) hours as needed for wheezing or shortness of breath., Disp: 8 g, Rfl: 0   benzonatate (TESSALON) 100 MG capsule, Take 1 capsule (100 mg total) by mouth 3 (three) times daily as  needed., Disp: 30 capsule, Rfl: 0   CALCIUM PO, Take by mouth., Disp: , Rfl:    Cholecalciferol (VITAMIN D3 PO), Take by mouth., Disp: , Rfl:    Cyanocobalamin (VITAMIN B-12 PO), Take by mouth., Disp: , Rfl:    FERROUS SULFATE PO, Take by mouth., Disp: , Rfl:    fluocinonide cream (LIDEX) 0.86 %, Apply 1 application topically 2 (two) times daily., Disp: 30 g, Rfl: 5   fluticasone (FLONASE) 50 MCG/ACT nasal spray, Place 2 sprays into both nostrils daily., Disp: 16 g, Rfl: 6   predniSONE (STERAPRED UNI-PAK 21 TAB) 10 MG (21) TBPK tablet, Use as directed, Disp: 21 tablet, Rfl: 0   promethazine-dextromethorphan (PROMETHAZINE-DM) 6.25-15 MG/5ML syrup, Take 5 mLs by mouth 4 (four) times daily as needed., Disp: 118 mL, Rfl: 0   rOPINIRole (REQUIP) 3 MG tablet, Take 1 tablet (3 mg total) by mouth at bedtime., Disp: 90 tablet, Rfl: 1  Assessment/ Plan: 41 y.o. female   Yeast vaginitis - Plan: fluconazole (DIFLUCAN) 150 MG tablet  Clinically consistent with yeast vaginitis.  Diflucan sent.  Take 1 tablet today.  Repeat in 3 days if ongoing symptoms.  Follow-up on Friday as scheduled with PCP  Start time: 12:02p End time: 12:07pm  Total time spent on patient care (including telephone call/ virtual visit): 5 minutes  [place], [place] [phone]

## 2022-02-19 NOTE — Patient Instructions (Signed)

## 2022-02-22 ENCOUNTER — Encounter: Payer: Self-pay | Admitting: Family Medicine

## 2022-02-22 ENCOUNTER — Ambulatory Visit: Payer: BC Managed Care – PPO | Admitting: Family Medicine

## 2022-02-22 VITALS — BP 109/74 | HR 101 | Temp 98.3°F | Ht 64.0 in | Wt 219.5 lb

## 2022-02-22 DIAGNOSIS — D563 Thalassemia minor: Secondary | ICD-10-CM

## 2022-02-22 DIAGNOSIS — F339 Major depressive disorder, recurrent, unspecified: Secondary | ICD-10-CM

## 2022-02-22 DIAGNOSIS — R0789 Other chest pain: Secondary | ICD-10-CM | POA: Diagnosis not present

## 2022-02-22 DIAGNOSIS — Z09 Encounter for follow-up examination after completed treatment for conditions other than malignant neoplasm: Secondary | ICD-10-CM

## 2022-02-22 DIAGNOSIS — B379 Candidiasis, unspecified: Secondary | ICD-10-CM

## 2022-02-22 DIAGNOSIS — R0609 Other forms of dyspnea: Secondary | ICD-10-CM | POA: Diagnosis not present

## 2022-02-22 DIAGNOSIS — D509 Iron deficiency anemia, unspecified: Secondary | ICD-10-CM | POA: Insufficient documentation

## 2022-02-22 DIAGNOSIS — T3695XA Adverse effect of unspecified systemic antibiotic, initial encounter: Secondary | ICD-10-CM

## 2022-02-22 DIAGNOSIS — F411 Generalized anxiety disorder: Secondary | ICD-10-CM | POA: Diagnosis not present

## 2022-02-22 MED ORDER — FLUCONAZOLE 150 MG PO TABS: ORAL_TABLET | ORAL | 0 refills | Status: DC

## 2022-02-22 MED ORDER — FLUOXETINE HCL 20 MG PO CAPS: 20.0000 mg | ORAL_CAPSULE | Freq: Every day | ORAL | 3 refills | Status: DC

## 2022-02-22 MED ORDER — NYSTATIN 100000 UNIT/GM EX CREA: 1.0000 | TOPICAL_CREAM | Freq: Two times a day (BID) | CUTANEOUS | 1 refills | Status: DC

## 2022-02-22 NOTE — Progress Notes (Signed)
Established Patient Office Visit  Subjective   Patient ID: Amanda Clarke, female    DOB: 11-12-1980  Age: 41 y.o. MRN: [id number]  Chief Complaint  Patient presents with   Hospitalization Follow-up    HPI Izza is here for a hospital follow up. She was admitted a [facility] [place] the [place] for atypical chest pain and shortness of breath.   She had flu A in early September. 1 week later she felt terrible and did a tele visit and was treated with antibiotics and steroids for bronchitis on 02/04/22 and then again on 02/09/22. She reports that her broncitis symptoms had improved but she did started feels a constant tightness and pressure in her chest and constant shortness of breath so she went to the ER for evaluation. She has admitted and had a thorough work up including a CTA of the chest, echo, and CT of the abdomen/pelvis that were normal. Toponins were negative. She was noted to have tachycardia and leukocytosis, likely due to steroid treatment, that improved prior to discharge. Anemia was stable with a normal iron panel. She had negative blood culture and a normal prolactin and BNP. She was told that her symptoms were likely due to uncontrolled anxiety.   She is now agreeable to trying something for anxiety again as she also believes that this is the cause of her symptoms. She reports that chest pain and shortness of breath has now resolved.   She does have a yeast infection since she was given IV abx in the hospital. She has had diflucan treatment with some improvement in vaginal itching and discharge. She reports that her perineum is raw, itchy, and irritated however.      02/22/2022   10:49 AM 08/13/2021    1:22 PM 07/02/2021    2:06 PM  Depression screen PHQ 2/9  Decreased Interest 2 1 2   Down, Depressed, Hopeless 3 1 2   PHQ - 2 Score 5 2 4   Altered sleeping 3 3 3   Tired, decreased energy 3 3 3   Change in appetite 3 0 1  Feeling bad or failure about  yourself  1 1 2   Trouble concentrating 1 0 0  Moving slowly or fidgety/restless 1 1 0  Suicidal thoughts 0 0 1  PHQ-9 Score 17 10 14   Difficult doing work/chores Very difficult Extremely dIfficult Somewhat difficult      02/22/2022   10:49 AM 08/13/2021    1:24 PM 07/02/2021    2:06 PM 05/28/2021    1:09 PM  GAD 7 : Generalized Anxiety Score  Nervous, Anxious, on Edge 3 1 1 1   Control/stop worrying 3 2 2 2   Worry too much - different things 3 2 2 2   Trouble relaxing 2 2 3 3   Restless 1 1 1 3   Easily annoyed or irritable 3 2 3 3   Afraid - awful might happen 2 2 3 1   Total GAD 7 Score 17 12 15 15   Anxiety Difficulty Very difficult Very difficult Very difficult Somewhat difficult       ROS Negative unless specially indicated above in HPI.   Objective:     BP 109/74   Pulse (!) 101   Temp 98.3 F (36.8 C) (Temporal)   Ht 5\' 4"  (1.626 m)   Wt 219 lb 8 oz (99.6 kg)   SpO2 96%   BMI 37.68 kg/m    Physical Exam Vitals and nursing note reviewed.  Constitutional:  General: She is not in acute distress.    Appearance: She is not ill-appearing, toxic-appearing or diaphoretic.  HENT:     Head: Normocephalic and atraumatic.     Nose: Nose normal.  Eyes:     Extraocular Movements: Extraocular movements intact.     Pupils: Pupils are equal, round, and reactive to light.  Cardiovascular:     Rate and Rhythm: Normal rate and regular rhythm.     Heart sounds: Normal heart sounds. No murmur heard. Pulmonary:     Effort: Pulmonary effort is normal. No respiratory distress.     Breath sounds: Normal breath sounds. No wheezing, rhonchi or rales.  Musculoskeletal:     Right lower leg: No edema.     Left lower leg: No edema.  Skin:    General: Skin is warm and dry.  Neurological:     General: No focal deficit present.     Mental Status: She is alert and oriented to person, place, and time.  Psychiatric:        Mood and Affect: Mood normal.        Behavior: Behavior  normal.    No results found for any visits on 02/22/22.    The ASCVD Risk score (Arnett DK, et al., 2019) failed to calculate for the following reasons:   The 2019 ASCVD risk score is only valid for ages 4 to 65    Assessment & Plan:   Rodney was seen today for hospitalization follow-up.  Diagnoses and all orders for this visit:  Atypical chest pain DOE (dyspnea on exertion) Reviewed hospital records, imaging results, and labs. Thorough negative pulmonary and cardiac workup. Likely due to uncontrolled anxiety that was worsened by high dose prednisone treatment.   Generalized anxiety disorder Depression, recurrent (HCC) Uncontrolled. Denies SI. Start prozac as below. -     FLUoxetine (PROZAC) 20 MG capsule; Take 1 capsule (20 mg total) by mouth daily.  Thalassemia trait Iron deficiency anemia, unspecified iron deficiency anemia type Stable. Managed by hematology.   Antibiotic-induced yeast infection Nystatin cream. Diflucan as below.  -     nystatin cream (MYCOSTATIN); Apply 1 Application topically 2 (two) times daily. -     fluconazole (DIFLUCAN) 150 MG tablet; Take one tablet by mouth now. May repeat in 3 days if needed.  Hospital discharge follow-up  Return in about 6 weeks (around 04/05/2022) for medication follow up, virtual ok if patient prefers. Sooner for new or worsening symptoms.   The patient indicates understanding of these issues and agrees with the plan.   Gabriel Earing, FNP

## 2022-03-12 ENCOUNTER — Other Ambulatory Visit: Payer: Self-pay | Admitting: Family Medicine

## 2022-03-12 DIAGNOSIS — G2581 Restless legs syndrome: Secondary | ICD-10-CM

## 2022-04-01 ENCOUNTER — Ambulatory Visit (INDEPENDENT_AMBULATORY_CARE_PROVIDER_SITE_OTHER): Payer: BC Managed Care – PPO | Admitting: Family Medicine

## 2022-04-01 ENCOUNTER — Encounter: Payer: Self-pay | Admitting: Family Medicine

## 2022-04-01 VITALS — BP 113/70 | HR 81 | Temp 98.0°F | Ht 64.0 in | Wt 223.5 lb

## 2022-04-01 DIAGNOSIS — G4701 Insomnia due to medical condition: Secondary | ICD-10-CM

## 2022-04-01 DIAGNOSIS — F411 Generalized anxiety disorder: Secondary | ICD-10-CM | POA: Diagnosis not present

## 2022-04-01 DIAGNOSIS — F339 Major depressive disorder, recurrent, unspecified: Secondary | ICD-10-CM | POA: Diagnosis not present

## 2022-04-01 MED ORDER — FLUOXETINE HCL 40 MG PO CAPS: 40.0000 mg | ORAL_CAPSULE | Freq: Every day | ORAL | 3 refills | Status: DC

## 2022-04-01 MED ORDER — TRAZODONE HCL 50 MG PO TABS: 50.0000 mg | ORAL_TABLET | Freq: Every day | ORAL | 1 refills | Status: DC

## 2022-04-01 NOTE — Progress Notes (Signed)
Established Patient Office Visit  Subjective   Patient ID: Amanda Clarke, female    DOB: 12-Aug-1980  Age: 41 y.o. MRN: [id number]  Chief Complaint  Patient presents with   Anxiety    Anxiety     Amanda Clarke is here for a follow up of anxiety and depression. She is currently taking prozac 20 mg. She has not noticed an improvement. She has noticed difficulty achieving orgasm but reports that this is not out of the norm for her and is not significant.   She continues to struggle significantly with her sleep. She tried trazodone in the recent past hat her sleep doctor sent in . She reports good results without side effects with trazodone and would like to try this again.      04/01/2022   11:12 AM 02/22/2022   10:49 AM 08/13/2021    1:22 PM  Depression screen PHQ 2/9  Decreased Interest 2 2 1   Down, Depressed, Hopeless 2 3 1   PHQ - 2 Score 4 5 2   Altered sleeping 3 3 3   Tired, decreased energy 3 3 3   Change in appetite 2 3 0  Feeling bad or failure about yourself  1 1 1   Trouble concentrating 1 1 0  Moving slowly or fidgety/restless 2 1 1   Suicidal thoughts 0 0 0  PHQ-9 Score 16 17 10   Difficult doing work/chores Very difficult Very difficult Extremely dIfficult      04/01/2022   11:13 AM 02/22/2022   10:49 AM 08/13/2021    1:24 PM 07/02/2021    2:06 PM  GAD 7 : Generalized Anxiety Score  Nervous, Anxious, on Edge 3 3 1 1   Control/stop worrying 3 3 2 2   Worry too much - different things 3 3 2 2   Trouble relaxing 3 2 2 3   Restless 2 1 1 1   Easily annoyed or irritable 3 3 2 3   Afraid - awful might happen 3 2 2 3   Total GAD 7 Score 20 17 12 15   Anxiety Difficulty Very difficult Very difficult Very difficult Very difficult       ROS As per HPI.    Objective:     BP 113/70   Pulse 81   Temp 98 F (36.7 C) (Temporal)   Ht 5\' 4"  (1.626 m)   Wt 223 lb 8 oz (101.4 kg)   SpO2 96%   BMI 38.36 kg/m    Physical Exam Vitals and nursing note reviewed.   Constitutional:      General: She is not in acute distress.    Appearance: She is not ill-appearing, toxic-appearing or diaphoretic.  Cardiovascular:     Rate and Rhythm: Normal rate and regular rhythm.     Heart sounds: Normal heart sounds. No murmur heard. Pulmonary:     Effort: Pulmonary effort is normal. No respiratory distress.     Breath sounds: Normal breath sounds.  Musculoskeletal:     Right lower leg: No edema.     Left lower leg: No edema.  Skin:    General: Skin is warm and dry.  Neurological:     General: No focal deficit present.     Mental Status: She is alert and oriented to person, place, and time.  Psychiatric:        Mood and Affect: Mood normal.        Behavior: Behavior normal.        Thought Content: Thought content normal.        Judgment:  Judgment normal.      No results found for any visits on 04/01/22.    The ASCVD Risk score (Arnett DK, et al., 2019) failed to calculate for the following reasons:   The 2019 ASCVD risk score is only valid for ages 9 to 3    Assessment & Plan:   Tristine was seen today for anxiety.  Diagnoses and all orders for this visit:  Generalized anxiety disorder Depression, recurrent (HCC) Uncontrolled. Denies SI. Will increase prozac to 40. Will add trazodone for sleep as lack of sleep is likely worsening anxiety and depression.  -     FLUoxetine (PROZAC) 40 MG capsule; Take 1 capsule (40 mg total) by mouth daily.  Insomnia due to medical condition Restart trazodone.  -     traZODone (DESYREL) 50 MG tablet; Take 1 tablet (50 mg total) by mouth at bedtime.  Return in about 6 weeks (around 05/13/2022) for medication follow up.  The patient indicates understanding of these issues and agrees with the plan.   Gabriel Earing, FNP

## 2022-04-08 ENCOUNTER — Inpatient Hospital Stay: Payer: BC Managed Care – PPO | Attending: Hematology

## 2022-04-08 DIAGNOSIS — D563 Thalassemia minor: Secondary | ICD-10-CM | POA: Insufficient documentation

## 2022-04-08 DIAGNOSIS — D508 Other iron deficiency anemias: Secondary | ICD-10-CM | POA: Diagnosis not present

## 2022-04-08 DIAGNOSIS — D649 Anemia, unspecified: Secondary | ICD-10-CM

## 2022-04-08 LAB — CBC WITH DIFFERENTIAL/PLATELET
Abs Immature Granulocytes: 0.05 10*3/uL (ref 0.00–0.07)
Basophils Absolute: 0.1 10*3/uL (ref 0.0–0.1)
Basophils Relative: 1 %
Eosinophils Absolute: 0.1 10*3/uL (ref 0.0–0.5)
Eosinophils Relative: 1 %
HCT: 32.2 % — ABNORMAL LOW (ref 36.0–46.0)
Hemoglobin: 10 g/dL — ABNORMAL LOW (ref 12.0–15.0)
Immature Granulocytes: 1 %
Lymphocytes Relative: 26 %
Lymphs Abs: 2.3 10*3/uL (ref 0.7–4.0)
MCH: 20.2 pg — ABNORMAL LOW (ref 26.0–34.0)
MCHC: 31.1 g/dL (ref 30.0–36.0)
MCV: 65.2 fL — ABNORMAL LOW (ref 80.0–100.0)
Monocytes Absolute: 0.6 10*3/uL (ref 0.1–1.0)
Monocytes Relative: 7 %
Neutro Abs: 5.7 10*3/uL (ref 1.7–7.7)
Neutrophils Relative %: 64 %
Platelets: 350 10*3/uL (ref 150–400)
RBC: 4.94 MIL/uL (ref 3.87–5.11)
RDW: 16.6 % — ABNORMAL HIGH (ref 11.5–15.5)
WBC: 8.8 10*3/uL (ref 4.0–10.5)
nRBC: 0.2 % (ref 0.0–0.2)

## 2022-04-08 LAB — LACTATE DEHYDROGENASE: LDH: 110 U/L (ref 98–192)

## 2022-04-08 LAB — FERRITIN: Ferritin: 51 ng/mL (ref 11–307)

## 2022-04-09 LAB — HAPTOGLOBIN: Haptoglobin: 212 mg/dL (ref 33–278)

## 2022-04-13 ENCOUNTER — Other Ambulatory Visit: Payer: Self-pay | Admitting: Hematology

## 2022-04-13 DIAGNOSIS — D649 Anemia, unspecified: Secondary | ICD-10-CM

## 2022-04-15 ENCOUNTER — Encounter: Payer: Self-pay | Admitting: Family Medicine

## 2022-04-15 ENCOUNTER — Ambulatory Visit (INDEPENDENT_AMBULATORY_CARE_PROVIDER_SITE_OTHER): Payer: BC Managed Care – PPO | Admitting: Family Medicine

## 2022-04-15 VITALS — BP 110/68 | HR 74 | Temp 98.4°F | Ht 64.0 in | Wt 224.1 lb

## 2022-04-15 DIAGNOSIS — R21 Rash and other nonspecific skin eruption: Secondary | ICD-10-CM | POA: Diagnosis not present

## 2022-04-15 DIAGNOSIS — F411 Generalized anxiety disorder: Secondary | ICD-10-CM

## 2022-04-15 DIAGNOSIS — G4701 Insomnia due to medical condition: Secondary | ICD-10-CM | POA: Diagnosis not present

## 2022-04-15 DIAGNOSIS — F339 Major depressive disorder, recurrent, unspecified: Secondary | ICD-10-CM

## 2022-04-15 DIAGNOSIS — G2581 Restless legs syndrome: Secondary | ICD-10-CM

## 2022-04-15 MED ORDER — TRIAMCINOLONE ACETONIDE 0.5 % EX OINT: 1.0000 | TOPICAL_OINTMENT | Freq: Two times a day (BID) | CUTANEOUS | 0 refills | Status: DC

## 2022-04-15 MED ORDER — DESVENLAFAXINE SUCCINATE ER 50 MG PO TB24: 50.0000 mg | ORAL_TABLET | Freq: Every day | ORAL | 1 refills | Status: DC

## 2022-04-15 MED ORDER — ROPINIROLE HCL 4 MG PO TABS: 4.0000 mg | ORAL_TABLET | Freq: Every day | ORAL | 1 refills | Status: DC

## 2022-04-15 MED ORDER — DOXEPIN HCL 10 MG PO CAPS: 10.0000 mg | ORAL_CAPSULE | Freq: Every day | ORAL | 1 refills | Status: DC

## 2022-04-15 NOTE — Progress Notes (Signed)
Acute Office Visit  Subjective:     Patient ID: Amanda Clarke, female    DOB: 1981-04-21, 41 y.o.   MRN: [id number]  Chief Complaint  Patient presents with   Anxiety    Anxiety     Here with husband today. Patient is in today for follow up of anxiety and depression. She started having increased swelling, joint pain, and a rash on her neck, abdomen, thigh, and leg. She decreased her prozac dosage and her side effects started to improve. She also has been having sexual side effects with prozac.   When she restarted on trazodone for sleep, she started having significant swelling in her feet. She no recalls that when she was last on trazodone she had swelling in her feet but related it to increased physical activity as they were in the process of moving. She did sleep well while on trazodone and this seemed to help with her anxiety and depression symptoms.   She has tried z-quil, benadryl, unisom in the past but these no longer work for her. She has trouble both falling asleep and staying sleep, less so with falling asleep and more so with maintaining sleep. She also has RLS. She increase her requip with 4 mg daily and this has really helped manage symptoms better.      04/15/2022    4:17 PM 04/01/2022   11:12 AM 02/22/2022   10:49 AM  Depression screen PHQ 2/9  Decreased Interest 2 2 2   Down, Depressed, Hopeless 2 2 3   PHQ - 2 Score 4 4 5   Altered sleeping 3 3 3   Tired, decreased energy 3 3 3   Change in appetite 3 2 3   Feeling bad or failure about yourself  2 1 1   Trouble concentrating 1 1 1   Moving slowly or fidgety/restless 1 2 1   Suicidal thoughts 0 0 0  PHQ-9 Score 17 16 17   Difficult doing work/chores Very difficult Very difficult Very difficult      04/15/2022    4:18 PM 04/01/2022   11:13 AM 02/22/2022   10:49 AM 08/13/2021    1:24 PM  GAD 7 : Generalized Anxiety Score  Nervous, Anxious, on Edge 3 3 3 1   Control/stop worrying 3 3 3 2   Worry too much - different  things 3 3 3 2   Trouble relaxing 2 3 2 2   Restless 2 2 1 1   Easily annoyed or irritable 3 3 3 2   Afraid - awful might happen 3 3 2 2   Total GAD 7 Score 19 20 17 12   Anxiety Difficulty Somewhat difficult Very difficult Very difficult Very difficult    ROS As per HPI.      Objective:    BP 110/68   Pulse 74   Temp 98.4 F (36.9 C) (Temporal)   Ht 5\' 4"  (1.626 m)   Wt 224 lb 2 oz (101.7 kg)   SpO2 96%   BMI 38.47 kg/m    Physical Exam Vitals and nursing note reviewed.  Constitutional:      General: She is not in acute distress.    Appearance: She is not ill-appearing, toxic-appearing or diaphoretic.  HENT:     Head: Normocephalic and atraumatic.  Eyes:     Extraocular Movements: Extraocular movements intact.     Pupils: Pupils are equal, round, and reactive to light.  Cardiovascular:     Rate and Rhythm: Normal rate and regular rhythm.     Heart sounds: Normal heart sounds. No murmur  heard. Pulmonary:     Effort: Pulmonary effort is normal. No respiratory distress.     Breath sounds: Normal breath sounds.  Musculoskeletal:     Cervical back: No rigidity.     Right lower leg: No edema.     Left lower leg: No edema.  Skin:    General: Skin is warm and dry.     Findings: Rash (Small erythematous papules to neck, abdomen. No exudate or warmth) present.  Neurological:     General: No focal deficit present.     Mental Status: She is alert and oriented to person, place, and time.  Psychiatric:        Mood and Affect: Mood normal.        Behavior: Behavior normal.        Thought Content: Thought content normal.        Judgment: Judgment normal.     No results found for any visits on 04/15/22.      Assessment & Plan:   Kynsley was seen today for anxiety.  Diagnoses and all orders for this visit:  Generalized anxiety disorder Depression, recurrent (HCC) Uncontrolled. Denies SI. Switch from prozac to pristiq due to side effects with prozac.  -      desvenlafaxine (PRISTIQ) 50 MG 24 hr tablet; Take 1 tablet (50 mg total) by mouth daily.  Insomnia due to medical condition D/c trazodone due to side effects. Try doxepin as below.  -     doxepin (SINEQUAN) 10 MG capsule; Take 1 capsule (10 mg total) by mouth at bedtime.  Rash Try kenalog as below.  -     triamcinolone ointment (KENALOG) 0.5 %; Apply 1 Application topically 2 (two) times daily.  Restless leg syndrome Well controlled on current regimen.  -     rOPINIRole (REQUIP) 4 MG tablet; Take 1 tablet (4 mg total) by mouth at bedtime.  Return in about 6 weeks (around 05/27/2022) for medication follow up.  The patient indicates understanding of these issues and agrees with the plan.  Gabriel Earing, FNP

## 2022-04-15 NOTE — Patient Instructions (Signed)
Here is a guide to help us find out which weight loss medications will be covered by your insurance plan.  Please check out this web site  [email or web address] and follow the 3 simple steps.   There is also a phone number you can call if you do not have access to the Internet. [phone] (Monday- Friday 8am-8pm)  [employer] provides coverage information for more than 80% of the inquiries submitted!!  

## 2022-04-29 ENCOUNTER — Telehealth: Payer: Self-pay | Admitting: Family Medicine

## 2022-04-30 NOTE — Telephone Encounter (Signed)
PT R/C 

## 2022-04-30 NOTE — Telephone Encounter (Signed)
Lmtcb.

## 2022-05-04 IMAGING — US US ART/VEN ABD/PELV/SCROTUM DOPPLER LTD
1 series · 13 of 25 positions shown · non-contrast
Comparison: None.

CLINICAL DATA: Left lower quadrant pain.

EXAM:
TRANSABDOMINAL AND TRANSVAGINAL ULTRASOUND OF PELVIS
DOPPLER ULTRASOUND OF OVARIES
TECHNIQUE: Both transabdominal and transvaginal ultrasound examinations of the
pelvis were performed. Transabdominal technique was performed for
global imaging of the pelvis including uterus, ovaries, adnexal
regions, and pelvic cul-de-sac.
It was necessary to proceed with endovaginal exam following the
transabdominal exam to visualize the bilateral ovaries. Color and
duplex Doppler ultrasound was utilized to evaluate blood flow to the
ovaries.

[Series 1: us pelvis (transabdominal only) · 13 of 69 slices shown]
[im 1/69]
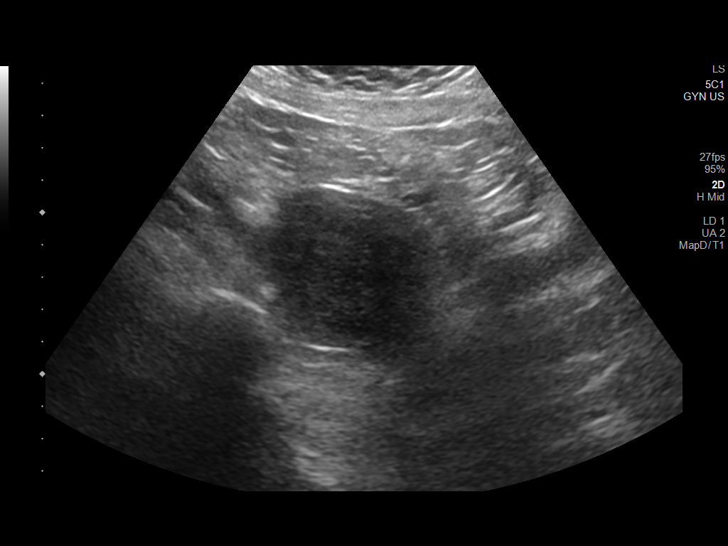
[im 6/69]
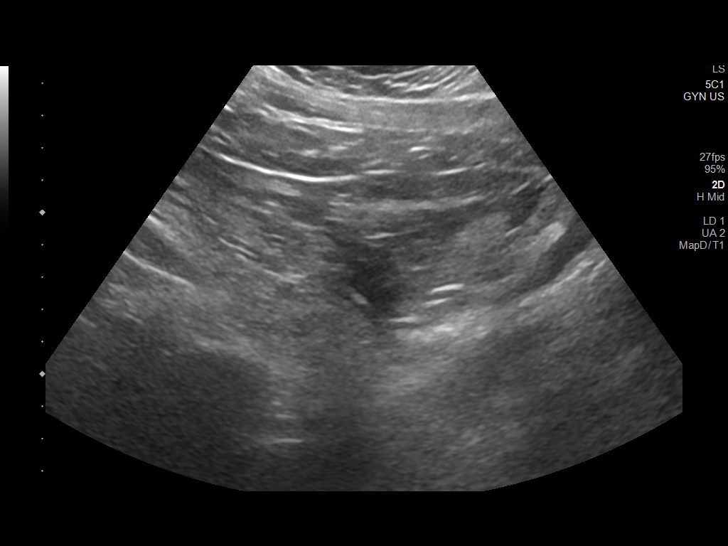
[im 12/69]
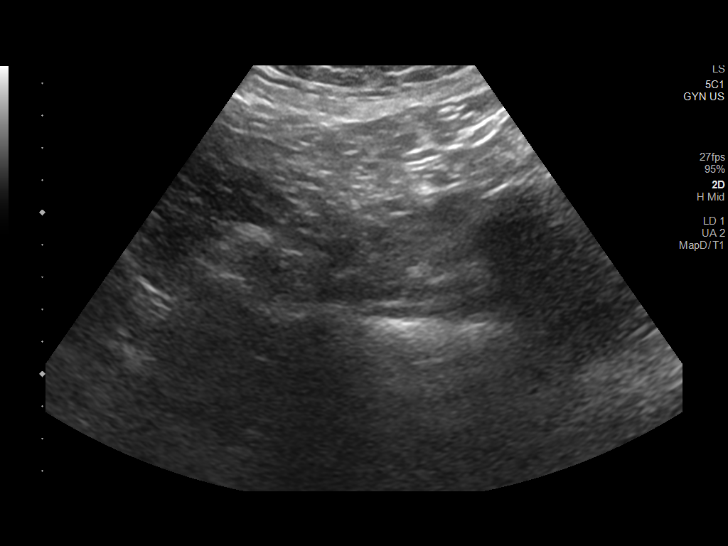
[im 18/69]
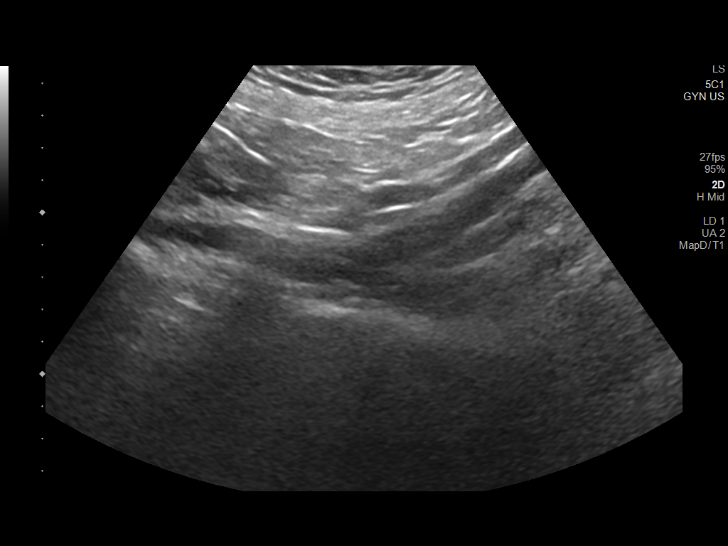
[im 23/69]
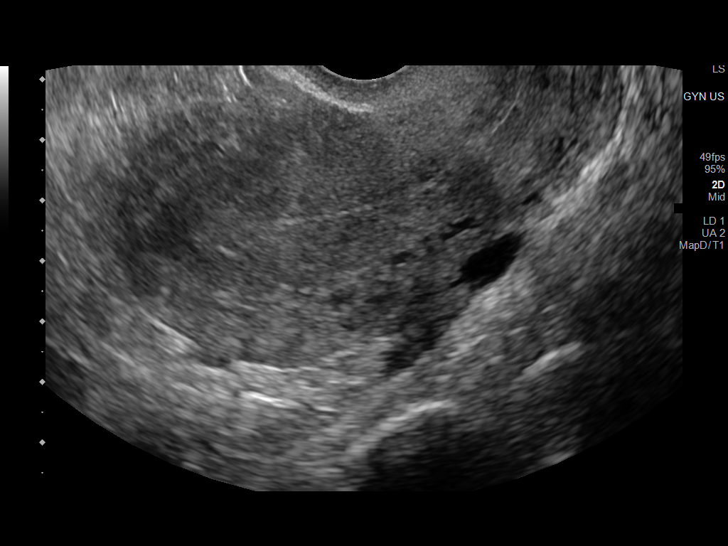
[im 29/69]
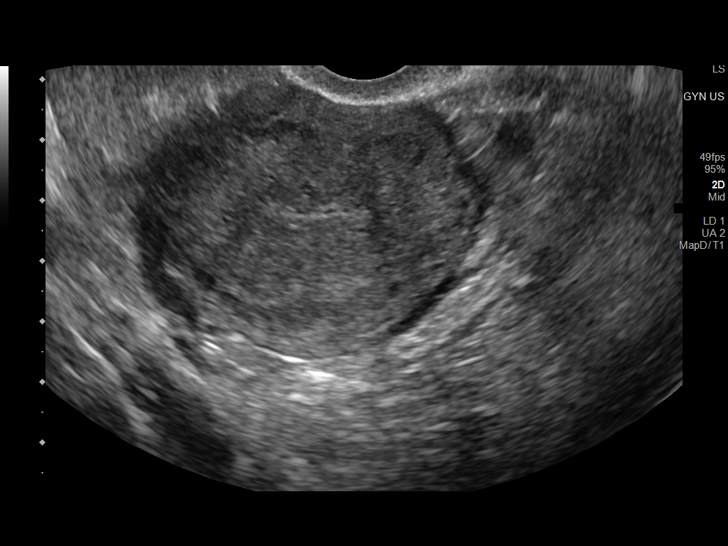
[im 35/69]
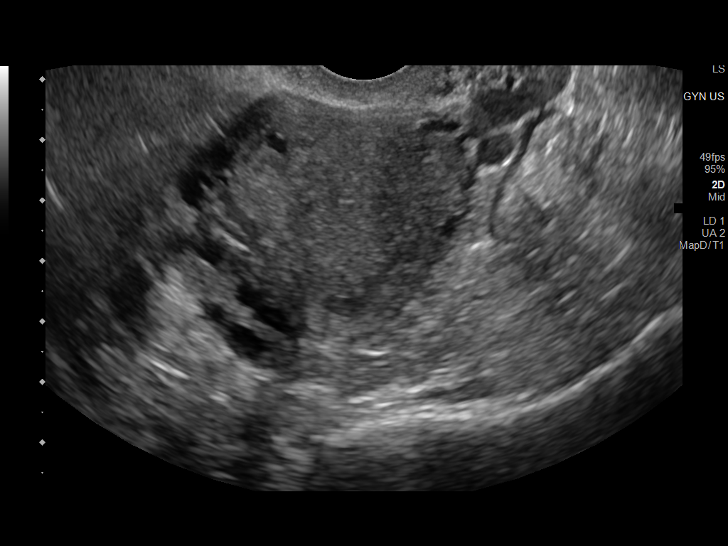
[im 40/69]
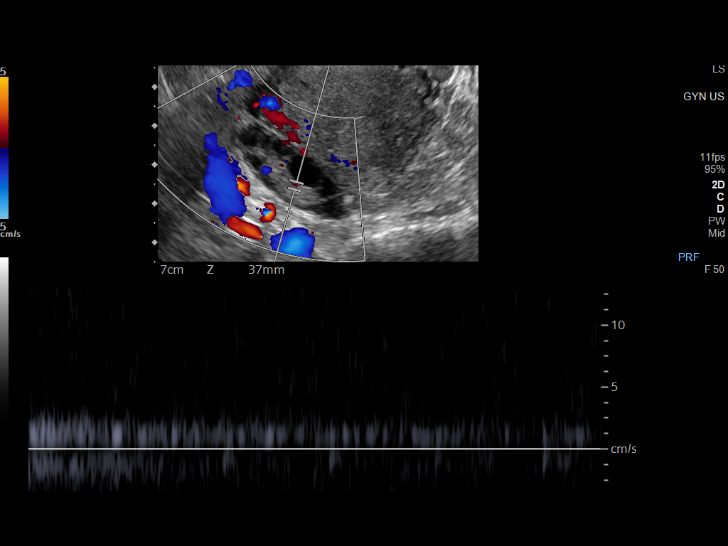
[im 46/69]
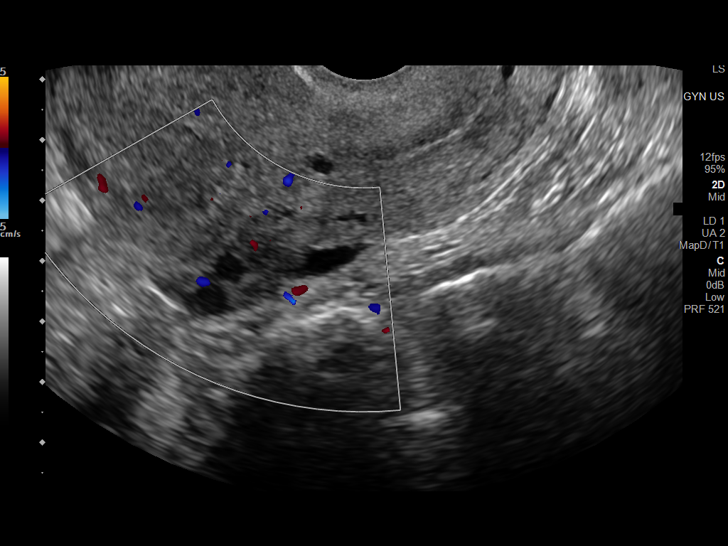
[im 52/69]
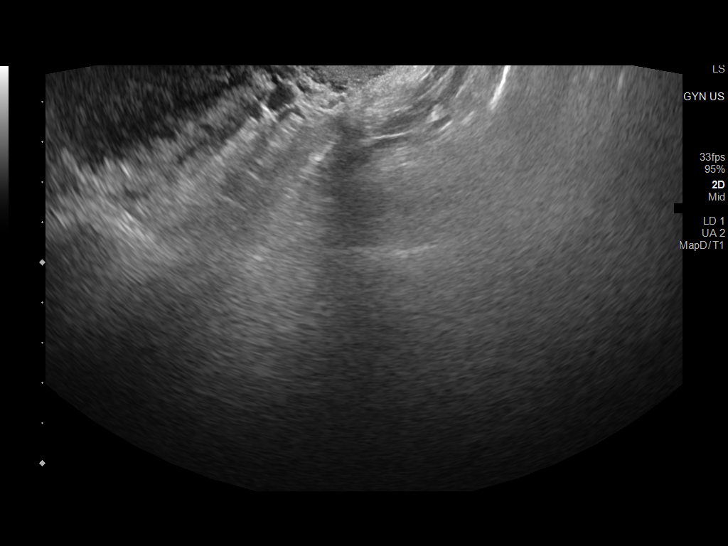
[im 57/69]
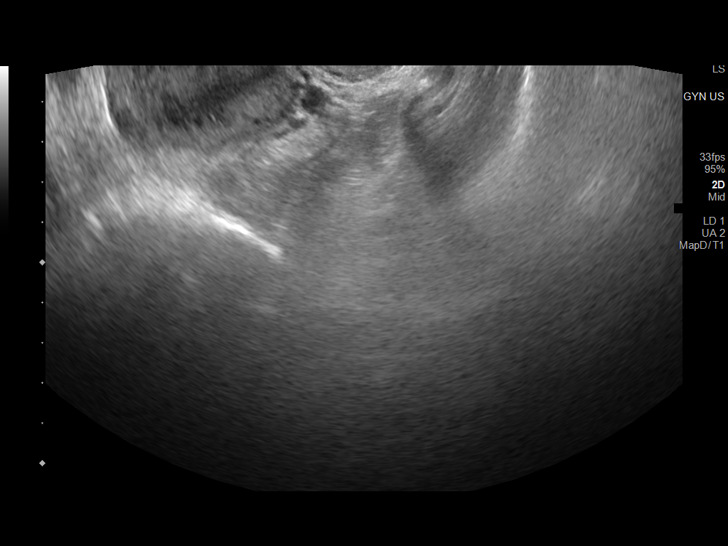
[im 63/69]
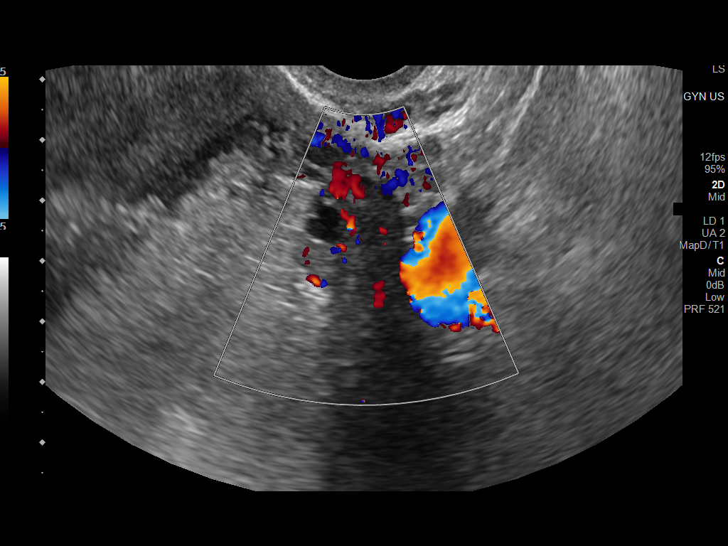
[im 69/69]
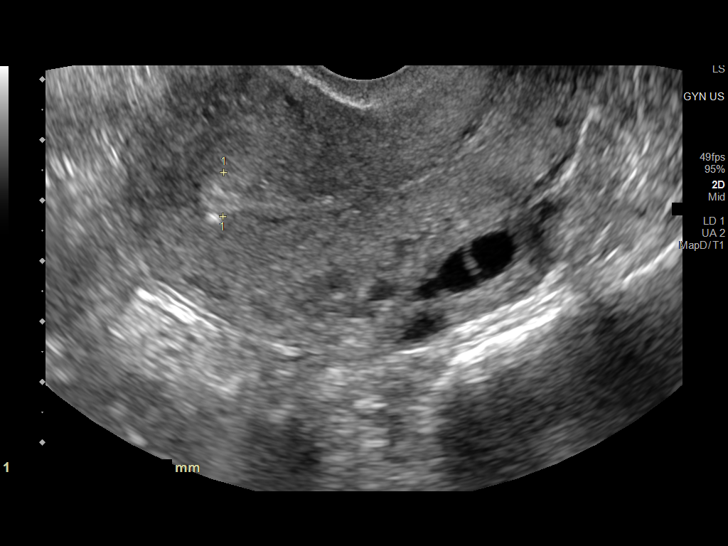

[13 of 25 positions shown; findings below may reference images not displayed]

FINDINGS: Uterus

Measurements: 8.8 x 4.7 x 5.4 cm = volume: 117 mL. No fibroids or
other mass visualized.

Endometrium

Thickness: 7 mm.  No focal abnormality visualized.

Right ovary

Measurements: 2.6 x 1.5 x 3.4 cm = volume: 7 mL. Normal
appearance/no adnexal mass.

Left ovary

Measurements: 2.4 x 2.1 x 2.1 cm = volume: 5 mL. Normal
appearance/no adnexal mass.

Pulsed Doppler evaluation demonstrates normal low-resistance
arterial and venous waveforms in both ovaries.

Other: Likely trace free fluid within the left adnexa.
IMPRESSION: 1. Likely trace free fluid within the left adnexa.
2. Otherwise unremarkable pelvic ultrasound.

## 2022-05-04 IMAGING — CT CT ABD-PELV W/ CM
2 of 4 series · 16 of 46 positions shown, 18 images · IV contrast (APPLIED)
Comparison: Pelvic ultrasound 08/07/2021

CLINICAL DATA: LLQ abdominal pain

EXAM:
CT ABDOMEN AND PELVIS WITH CONTRAST
TECHNIQUE: Multidetector CT imaging of the abdomen and pelvis was performed
using the standard protocol following bolus administration of
intravenous contrast.

[Series 3: abd/ pelvis 5.0 i30f 2 · axial · 0.96mm/px · z∈[-977,-557]mm · 13 of 92 slices shown, 15 images]
[im 4/92  soft-tissue]
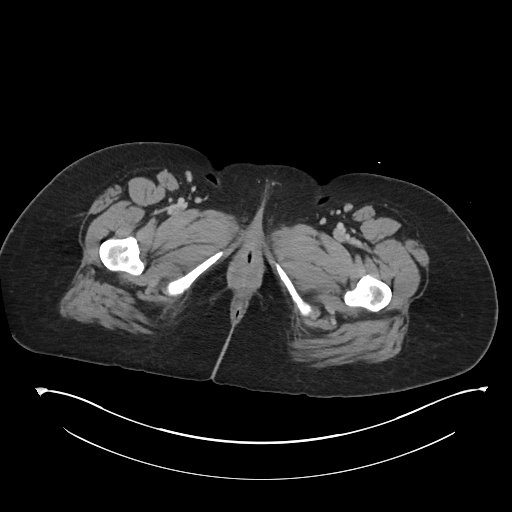
[im 4/92  bone]
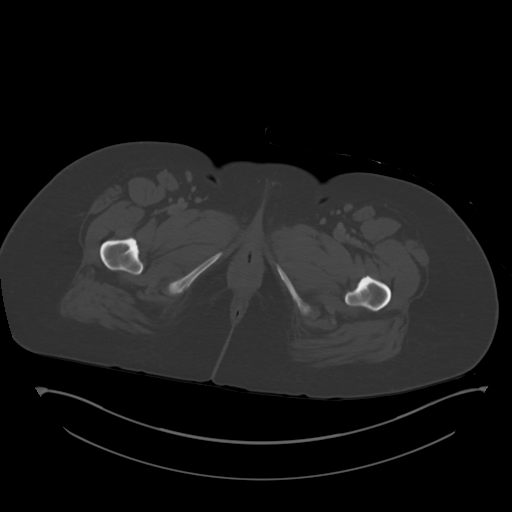
[im 11/92  soft-tissue]
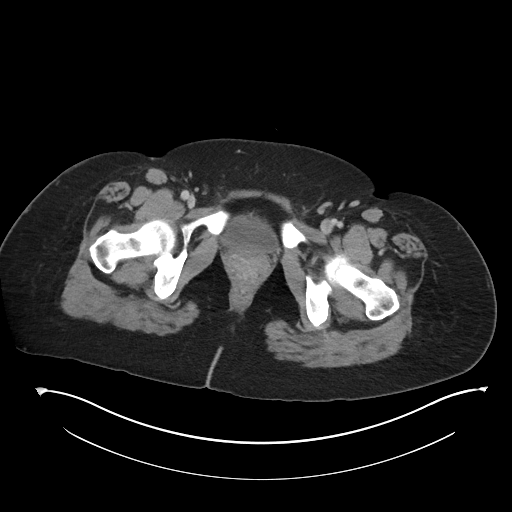
[im 19/92  soft-tissue]
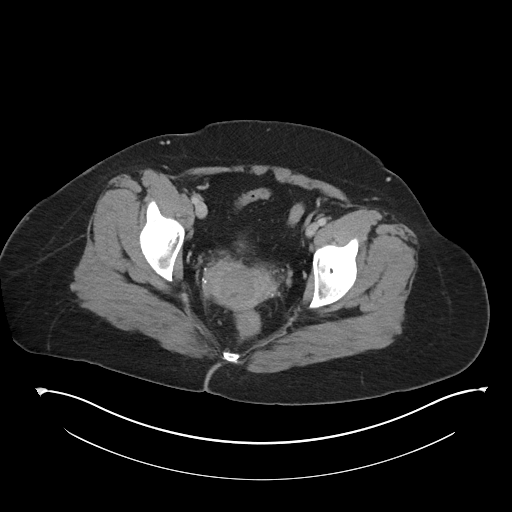
[im 26/92  soft-tissue]
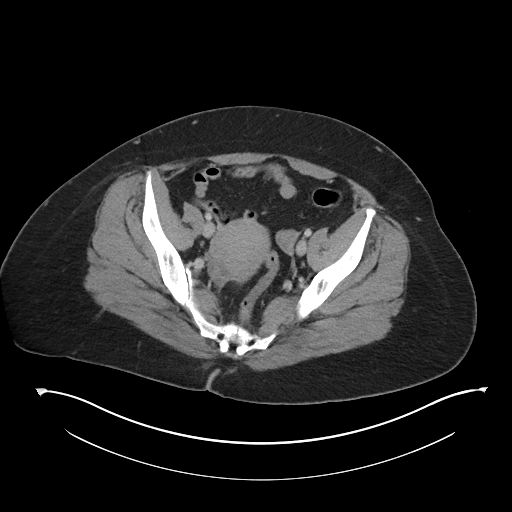
[im 33/92  soft-tissue]
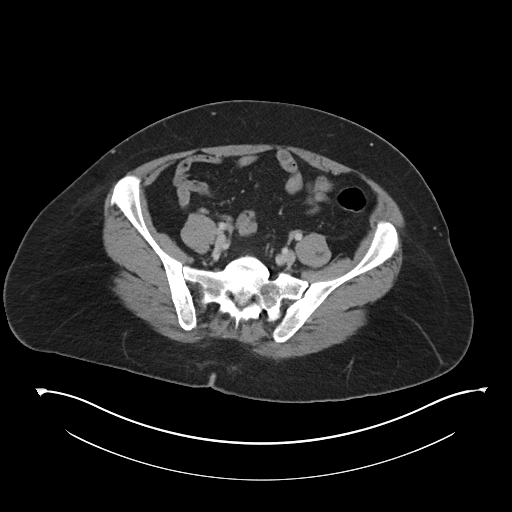
[im 41/92  soft-tissue]
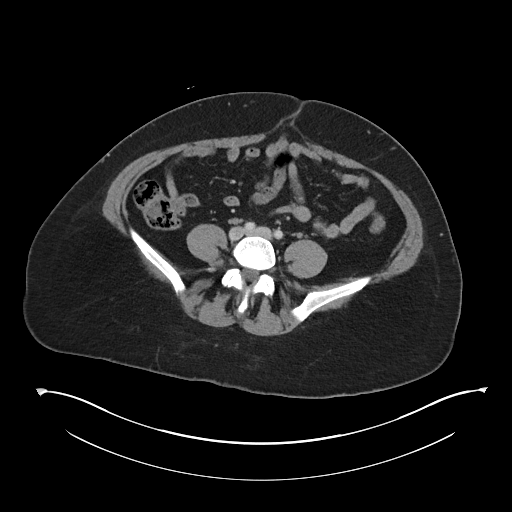
[im 48/92  soft-tissue]
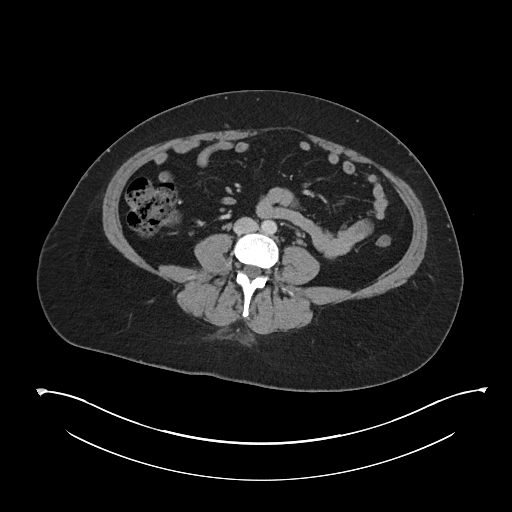
[im 51/92  soft-tissue]
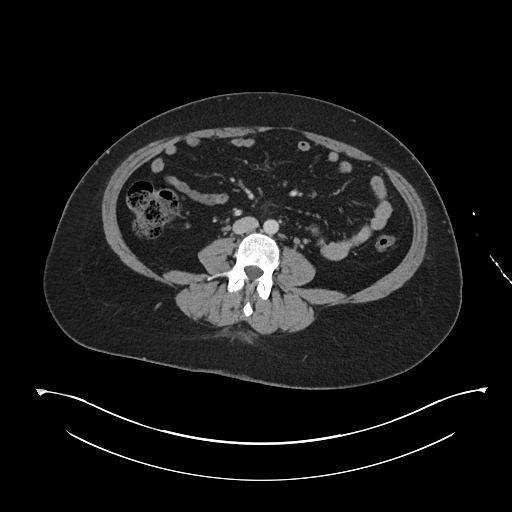
[im 59/92  soft-tissue]
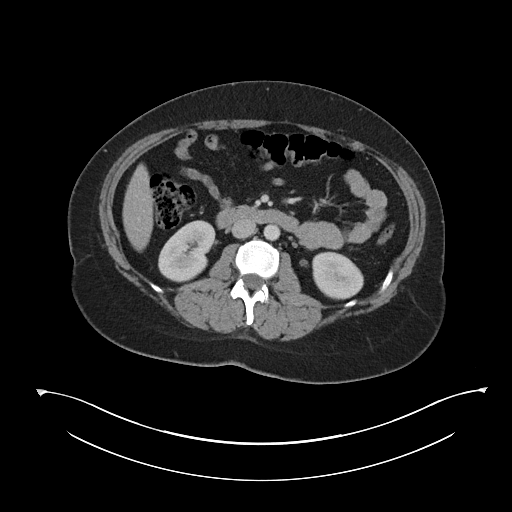
[im 59/92  bone]
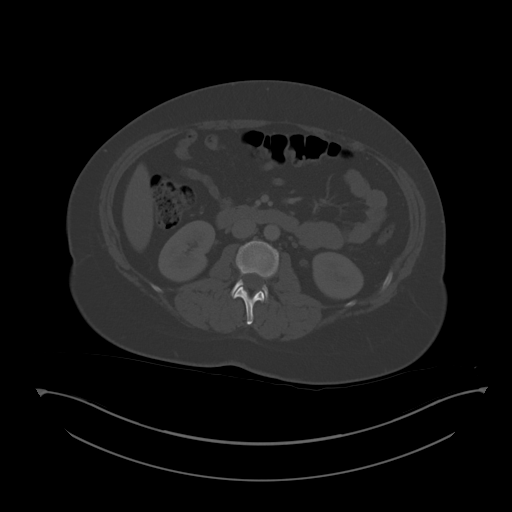
[im 66/92  soft-tissue]
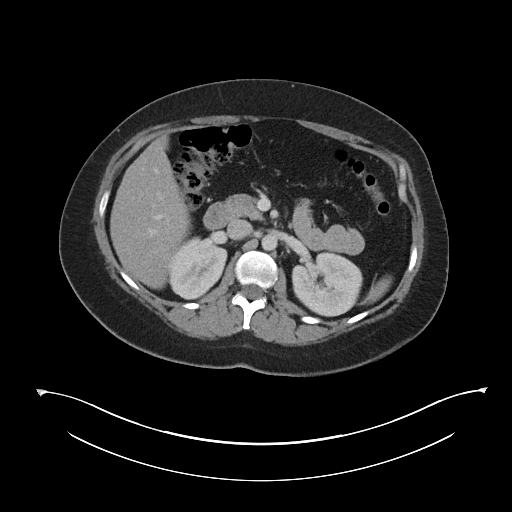
[im 73/92  soft-tissue]
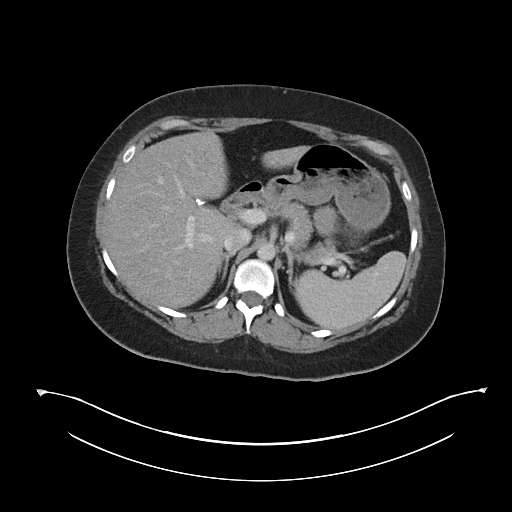
[im 81/92  soft-tissue]
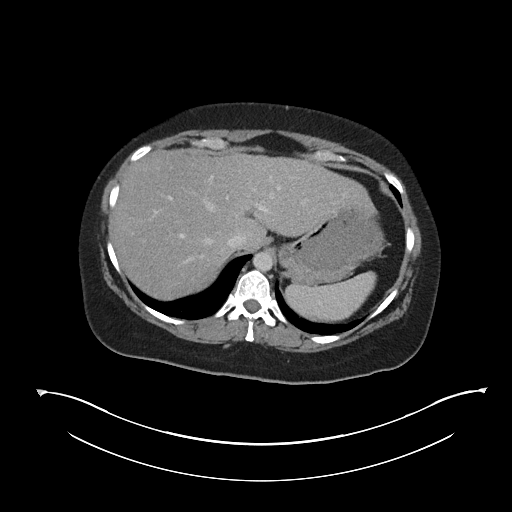
[im 88/92  soft-tissue]
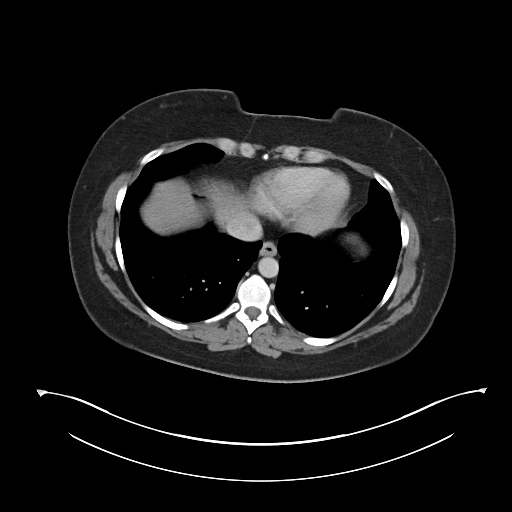

[Series 6: coronal soft tissue · coronal · 0.89mm/px · 3 of 102 slices shown]
[im 34/102  soft-tissue]
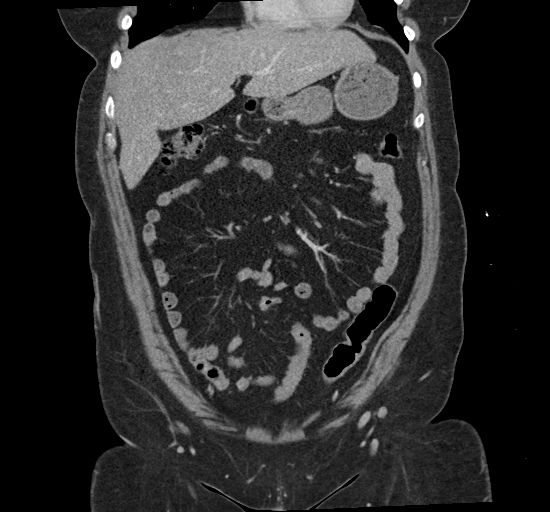
[im 45/102  soft-tissue]
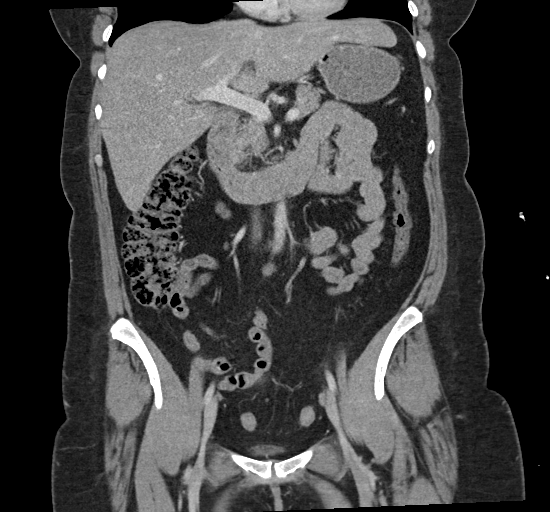
[im 57/102  soft-tissue]
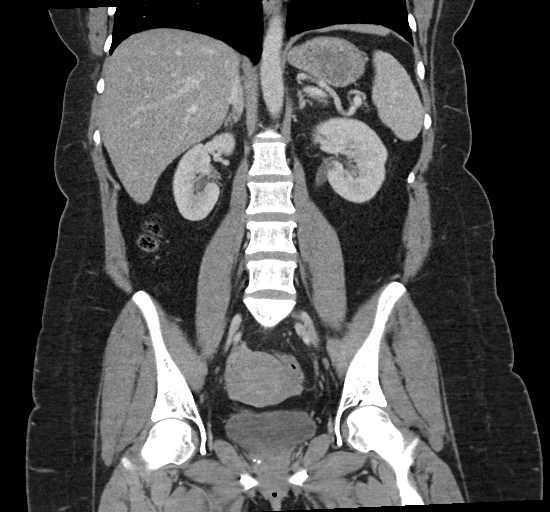

[16 of 46 positions shown; findings below may reference images not displayed]

RADIATION DOSE REDUCTION: This exam was performed according to the
departmental dose-optimization program which includes automated
exposure control, adjustment of the mA and/or kV according to
patient size and/or use of iterative reconstruction technique.

CONTRAST:  100mL OMNIPAQUE IOHEXOL 300 MG/ML  SOLN
FINDINGS: Lower chest: No acute abnormality.

Hepatobiliary: No focal liver abnormality. Status post
cholecystectomy. No biliary dilatation.

Pancreas: No focal lesion. Normal pancreatic contour. No surrounding
inflammatory changes. No main pancreatic ductal dilatation.

Spleen: Normal in size without focal abnormality.

Adrenals/Urinary Tract:

No adrenal nodule bilaterally.

Bilateral kidneys enhance symmetrically.

No hydronephrosis. No hydroureter.

The urinary bladder is unremarkable.

Stomach/Bowel: Stomach is within normal limits. No evidence of bowel
wall thickening or dilatation. Appendix appears normal.

Vascular/Lymphatic: No abdominal aorta or iliac aneurysm. No
abdominal, pelvic, or inguinal lymphadenopathy.

Reproductive: Uterus and bilateral adnexa are unremarkable.

Other: No intraperitoneal free fluid. No intraperitoneal free gas.
No organized fluid collection.

Musculoskeletal:

No abdominal wall hernia or abnormality.

No suspicious lytic or blastic osseous lesions. No acute displaced
fracture.
IMPRESSION: 1. No acute intra-abdominal or intrapelvic abnormality.
2. Status post cholecystectomy.

## 2022-05-09 ENCOUNTER — Ambulatory Visit (INDEPENDENT_AMBULATORY_CARE_PROVIDER_SITE_OTHER): Payer: BC Managed Care – PPO | Admitting: Family Medicine

## 2022-05-09 ENCOUNTER — Encounter: Payer: Self-pay | Admitting: Family Medicine

## 2022-05-09 VITALS — BP 107/66 | HR 100 | Temp 97.9°F | Ht 64.0 in | Wt 232.0 lb

## 2022-05-09 DIAGNOSIS — G4701 Insomnia due to medical condition: Secondary | ICD-10-CM

## 2022-05-09 DIAGNOSIS — E669 Obesity, unspecified: Secondary | ICD-10-CM

## 2022-05-09 DIAGNOSIS — F339 Major depressive disorder, recurrent, unspecified: Secondary | ICD-10-CM

## 2022-05-09 DIAGNOSIS — F5231 Female orgasmic disorder: Secondary | ICD-10-CM | POA: Diagnosis not present

## 2022-05-09 DIAGNOSIS — F411 Generalized anxiety disorder: Secondary | ICD-10-CM | POA: Diagnosis not present

## 2022-05-09 MED ORDER — BUPROPION HCL ER (XL) 150 MG PO TB24: 150.0000 mg | ORAL_TABLET | Freq: Every day | ORAL | 0 refills | Status: DC

## 2022-05-09 MED ORDER — SEMAGLUTIDE-WEIGHT MANAGEMENT 2.4 MG/0.75ML ~~LOC~~ SOAJ: 2.4000 mg | SUBCUTANEOUS | 6 refills | Status: DC

## 2022-05-09 MED ORDER — SEMAGLUTIDE-WEIGHT MANAGEMENT 1 MG/0.5ML ~~LOC~~ SOAJ: 1.0000 mg | SUBCUTANEOUS | 0 refills | Status: DC

## 2022-05-09 MED ORDER — SEMAGLUTIDE-WEIGHT MANAGEMENT 0.5 MG/0.5ML ~~LOC~~ SOAJ: 0.5000 mg | SUBCUTANEOUS | 0 refills | Status: DC

## 2022-05-09 MED ORDER — SEMAGLUTIDE-WEIGHT MANAGEMENT 0.25 MG/0.5ML ~~LOC~~ SOAJ: 0.2500 mg | SUBCUTANEOUS | 0 refills | Status: AC

## 2022-05-09 MED ORDER — ESZOPICLONE 1 MG PO TABS: 1.0000 mg | ORAL_TABLET | Freq: Every evening | ORAL | 1 refills | Status: DC | PRN

## 2022-05-09 MED ORDER — SEMAGLUTIDE-WEIGHT MANAGEMENT 1.7 MG/0.75ML ~~LOC~~ SOAJ: 1.7000 mg | SUBCUTANEOUS | 0 refills | Status: DC

## 2022-05-09 NOTE — Progress Notes (Signed)
Acute Office Visit  Subjective:     Patient ID: Amanda Clarke, female    DOB: April 02, 1981, 41 y.o.   MRN: [id number]  Chief Complaint  Patient presents with   Medication Problem   Insomnia    Pt states she is unable to go to sleep and asleep     HPI Patient is in today for follow up of insomnia. She has difficulty both falling asleep and staying asleep. She has tried and failed numerous OTC sleep aids, trazodone, and now doxepin.   She recently switch to pristiq for anxiety and depression. She does feel like this is helping some. She is still unable to orgasm despite switch from Prozac to pristiq. She denies other side effect.   She has check her insurance regarding coverage for [facility]. She does have coverage for this with a prior authorization. She has tried diet and exercise without significant weight loss.       05/09/2022    2:42 PM 04/15/2022    4:17 PM 04/01/2022   11:12 AM  Depression screen PHQ 2/9  Decreased Interest 1 2 2   Down, Depressed, Hopeless 2 2 2   PHQ - 2 Score 3 4 4   Altered sleeping 2 3 3   Tired, decreased energy 2 3 3   Change in appetite 3 3 2   Feeling bad or failure about yourself  2 2 1   Trouble concentrating 1 1 1   Moving slowly or fidgety/restless 1 1 2   Suicidal thoughts 0 0 0  PHQ-9 Score 14 17 16   Difficult doing work/chores Very difficult Very difficult Very difficult      05/09/2022    2:42 PM 04/15/2022    4:18 PM 04/01/2022   11:13 AM 02/22/2022   10:49 AM  GAD 7 : Generalized Anxiety Score  Nervous, Anxious, on Edge 3 3 3 3   Control/stop worrying 3 3 3 3   Worry too much - different things 2 3 3 3   Trouble relaxing 2 2 3 2   Restless 2 2 2 1   Easily annoyed or irritable 3 3 3 3   Afraid - awful might happen 2 3 3 2   Total GAD 7 Score 17 19 20 17   Anxiety Difficulty Somewhat difficult Somewhat difficult Very difficult Very difficult     ROS As per HPI.      Objective:    BP 107/66   Pulse 100   Temp 97.9 F (36.6 C)    Ht 5\' 4"  (1.626 m)   Wt 232 lb (105.2 kg)   LMP 04/16/2022 (Within Days)   SpO2 96%   BMI 39.82 kg/m    Physical Exam Vitals and nursing note reviewed.  Constitutional:      General: She is not in acute distress.    Appearance: She is obese. She is not ill-appearing, toxic-appearing or diaphoretic.  Cardiovascular:     Rate and Rhythm: Regular rhythm.     Heart sounds: Normal heart sounds. No murmur heard. Pulmonary:     Effort: Pulmonary effort is normal. No respiratory distress.     Breath sounds: Normal breath sounds.  Abdominal:     General: There is no distension.     Palpations: Abdomen is soft.     Tenderness: There is no abdominal tenderness.  Musculoskeletal:     Right lower leg: No edema.     Left lower leg: No edema.  Skin:    General: Skin is warm and dry.  Neurological:     General: No focal deficit  present.     Mental Status: She is alert and oriented to person, place, and time.  Psychiatric:        Mood and Affect: Mood normal.        Behavior: Behavior normal.        Thought Content: Thought content normal.        Judgment: Judgment normal.     No results found for any visits on 05/09/22.      Assessment & Plan:   Amanda Clarke was seen today for medication problem and insomnia.  Diagnoses and all orders for this visit:  Insomnia due to medical condition Failed OTC sleep aids, trazodone, doxepin. Will try lunesta. Discussed that this is controlled and will need to sign CSA and obtain UDS at follow up appointment if lunesta is effective.  -     eszopiclone (LUNESTA) 1 MG TABS tablet; Take 1 tablet (1 mg total) by mouth at bedtime as needed for sleep. Take immediately before bedtime  Depression, recurrent (HCC) Generalized anxiety disorder Not well controlled, but improving. Denies SI. Continue pristiq as this was recently started. Will add wellbutrin for sexual side effects related to SSRI.  -     buPROPion (WELLBUTRIN XL) 150 MG 24 hr tablet; Take 1  tablet (150 mg total) by mouth daily.  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  Obesity (BMI 30-39.9) Wegovy discussed and ordered. Diet and exercise along with wegovy. Discussed possible side effects. No family hx of medullary thyroid cancer.  -     Semaglutide-Weight Management 0.25 MG/0.5ML SOAJ; Inject 0.25 mg into the skin once a week for 28 days. -     Semaglutide-Weight Management 0.5 MG/0.5ML SOAJ; Inject 0.5 mg into the skin once a week for 28 days. -     Semaglutide-Weight Management 1 MG/0.5ML SOAJ; Inject 1 mg into the skin once a week for 28 days. -     Semaglutide-Weight Management 1.7 MG/0.75ML SOAJ; Inject 1.7 mg into the skin once a week for 28 days. -     Semaglutide-Weight Management 2.4 MG/0.75ML SOAJ; Inject 2.4 mg into the skin once a week for 28 days.  Return in about 6 weeks (around 06/20/2022) for medication follow up.  The patient indicates understanding of these issues and agrees with the plan.  Gabriel Earing, FNP

## 2022-05-14 NOTE — Telephone Encounter (Signed)
Attempts to contact pt without a return call in over 3 days, will close encounter. °

## 2022-05-20 ENCOUNTER — Ambulatory Visit: Payer: BC Managed Care – PPO | Admitting: Family Medicine

## 2022-05-22 ENCOUNTER — Telehealth: Payer: Self-pay | Admitting: Family Medicine

## 2022-05-22 DIAGNOSIS — G4701 Insomnia due to medical condition: Secondary | ICD-10-CM

## 2022-05-22 NOTE — Telephone Encounter (Signed)
Pt called to update PCP on the new sleep medicine she has been taking. Says the has found that taking 3 mg helps her the best. Wants to know if new Rx can be sent in for her. Pt uses [facility] in [place].

## 2022-05-23 ENCOUNTER — Telehealth: Payer: Self-pay | Admitting: *Deleted

## 2022-05-23 DIAGNOSIS — W010XXA Fall on same level from slipping, tripping and stumbling without subsequent striking against object, initial encounter: Secondary | ICD-10-CM | POA: Diagnosis not present

## 2022-05-23 DIAGNOSIS — R296 Repeated falls: Secondary | ICD-10-CM | POA: Diagnosis not present

## 2022-05-23 DIAGNOSIS — S7001XA Contusion of right hip, initial encounter: Secondary | ICD-10-CM | POA: Diagnosis not present

## 2022-05-23 DIAGNOSIS — M25551 Pain in right hip: Secondary | ICD-10-CM | POA: Diagnosis not present

## 2022-05-23 DIAGNOSIS — S70211A Abrasion, right hip, initial encounter: Secondary | ICD-10-CM | POA: Diagnosis not present

## 2022-05-23 MED ORDER — ESZOPICLONE 3 MG PO TABS: 3.0000 mg | ORAL_TABLET | Freq: Every day | ORAL | 1 refills | Status: DC

## 2022-05-23 NOTE — Telephone Encounter (Signed)
Pt aware of provider feedback and voiced understanding. 

## 2022-05-23 NOTE — Telephone Encounter (Signed)
I have sent this in for her. 3 mg is the maximum dosage for lunesta. Keep follow up appointment. She will need to complete UDS and CSA at that appointment to continue lunesta.

## 2022-05-23 NOTE — Telephone Encounter (Signed)
Amanda Clarke  ([name]) Rx #: [id number] 0.25MG /0.5ML auto-injectors  Sent to Plan

## 2022-05-27 ENCOUNTER — Telehealth: Payer: Self-pay

## 2022-05-27 NOTE — Telephone Encounter (Signed)
VEGAS COFFIN ([name]) Rx #: [id number] 0.25MG /0.5ML auto-injectors Form Blue Building control surveyor Form (CB) Created 9 days ago Sent to Plan 4 days ago Plan Response 4 days ago Submit Clinical Questions 4 days ago Determination Unfavorable 2 days ago Your prior authorization for Mancel Parsons has been denied. RETURN TO DASHBOARD When applicable, information about how to complete an appeal for this patient will be sent to you. Please also see the determination letter provided by the payer/PBM for more information.

## 2022-05-27 NOTE — Telephone Encounter (Signed)
Transition Care Management Follow-up Telephone Call Date of discharge and from where: 05/24/22 [place]  How have you been since you were released from the hospital? Patient states that she is doing well - still sore Any questions or concerns? Yes  Items Reviewed: Did the pt receive and understand the discharge instructions provided? Yes  Medications obtained and verified? Yes  Other? Yes  Any new allergies since your discharge? No  Dietary orders reviewed? Yes Do you have support at home? Yes   Home Care and Equipment/Supplies: Were home health services ordered? not applicable If so, what is the name of the agency? na  Has the agency set up a time to come to the patient's home? not applicable Were any new equipment or medical supplies ordered?  No What is the name of the medical supply agency? na Were you able to get the supplies/equipment? not applicable Do you have any questions related to the use of the equipment or supplies? No  Functional Questionnaire: (I = Independent and D = Dependent) ADLs: i  Bathing/Dressing- i  Meal Prep- i  Eating- i  Maintaining continence- i  Transferring/Ambulation- i  Managing Meds- i  Follow up appointments reviewed:  PCP Hospital f/u appt confirmed? No  Patient declines at this time she will follow up as needed  [facility] f/u appt confirmed? No  Are transportation arrangements needed? No  If their condition worsens, is the pt aware to call PCP or go to the Emergency Dept.? Yes Was the patient provided with contact information for the PCP's office or ED? Yes Was to pt encouraged to call back with questions or concerns? Yes

## 2022-05-28 NOTE — Telephone Encounter (Signed)
Appeal is available on CMM ([place])

## 2022-07-01 ENCOUNTER — Ambulatory Visit (INDEPENDENT_AMBULATORY_CARE_PROVIDER_SITE_OTHER): Payer: BC Managed Care – PPO | Admitting: Family Medicine

## 2022-07-01 ENCOUNTER — Other Ambulatory Visit (HOSPITAL_COMMUNITY): Payer: Self-pay

## 2022-07-01 ENCOUNTER — Encounter: Payer: Self-pay | Admitting: Family Medicine

## 2022-07-01 VITALS — BP 112/70 | HR 86 | Temp 98.4°F | Ht 64.0 in | Wt 231.2 lb

## 2022-07-01 DIAGNOSIS — E669 Obesity, unspecified: Secondary | ICD-10-CM

## 2022-07-01 DIAGNOSIS — F339 Major depressive disorder, recurrent, unspecified: Secondary | ICD-10-CM | POA: Diagnosis not present

## 2022-07-01 DIAGNOSIS — F411 Generalized anxiety disorder: Secondary | ICD-10-CM

## 2022-07-01 DIAGNOSIS — G4701 Insomnia due to medical condition: Secondary | ICD-10-CM

## 2022-07-01 DIAGNOSIS — D649 Anemia, unspecified: Secondary | ICD-10-CM

## 2022-07-01 MED ORDER — SEMAGLUTIDE-WEIGHT MANAGEMENT 1.7 MG/0.75ML ~~LOC~~ SOAJ
1.7000 mg | SUBCUTANEOUS | 0 refills | Status: AC
  Filled 2022-07-01: qty 3, 28d supply, fill #0

## 2022-07-01 MED ORDER — SEMAGLUTIDE-WEIGHT MANAGEMENT 0.5 MG/0.5ML ~~LOC~~ SOAJ
0.5000 mg | SUBCUTANEOUS | 0 refills | Status: AC
  Filled 2022-07-01 – 2022-09-17 (×2): qty 2, 28d supply, fill #0

## 2022-07-01 MED ORDER — ZOLPIDEM TARTRATE 5 MG PO TABS: 5.0000 mg | ORAL_TABLET | Freq: Every evening | ORAL | 0 refills | Status: DC | PRN

## 2022-07-01 MED ORDER — SEMAGLUTIDE-WEIGHT MANAGEMENT 1 MG/0.5ML ~~LOC~~ SOAJ
1.0000 mg | SUBCUTANEOUS | 0 refills | Status: AC
  Filled 2022-07-01: qty 2, 28d supply, fill #0

## 2022-07-01 MED ORDER — SEMAGLUTIDE-WEIGHT MANAGEMENT 2.4 MG/0.75ML ~~LOC~~ SOAJ
2.4000 mg | SUBCUTANEOUS | 6 refills | Status: AC
  Filled 2022-07-01: qty 3, fill #0

## 2022-07-01 NOTE — Progress Notes (Signed)
Established Patient Office Visit  Subjective   Patient ID: Amanda Clarke, female    DOB: 10-May-1981  Age: 42 y.o. MRN: [id number]  Chief Complaint  Patient presents with   Medical Management of Chronic Issues   Insomnia   Anxiety   Depression    HPI Insomnia Difficulty with sleep onset and maintenance. Amanda Clarke has been using lunesa 3 mg to help with insomnia. This was initially helpful, but she is continuing to have frequent wakes up with difficulty returning back to sleep. Failed: trazodone, remeron, doxepin, OTC sleep aids, sleep hygiene.   2. Depression/anxiety She reports that pristiq has been more helpful than prozac. Wellbutrin did relieve sexual side effects. She does not feel like her symptoms are controlled well at all. She is interested in a referral.  Failed: prozac due to sexual side effects.   3. Anemia Repots continued fatigue. She has a follow up with hematology net week with labs.   4. Obesity She has been unable to get [facility] due to back order.      07/01/2022    9:25 AM 05/09/2022    2:42 PM 04/15/2022    4:17 PM  Depression screen PHQ 2/9  Decreased Interest 1 1 2  $ Down, Depressed, Hopeless 2 2 2  $ PHQ - 2 Score 3 3 4  $ Altered sleeping 3 2 3  $ Tired, decreased energy 3 2 3  $ Change in appetite 3 3 3  $ Feeling bad or failure about yourself  1 2 2  $ Trouble concentrating 1 1 1  $ Moving slowly or fidgety/restless 2 1 1  $ Suicidal thoughts 0 0 0  PHQ-9 Score 16 14 17  $ Difficult doing work/chores Very difficult Very difficult Very difficult      07/01/2022    9:25 AM 05/09/2022    2:42 PM 04/15/2022    4:18 PM 04/01/2022   11:13 AM  GAD 7 : Generalized Anxiety Score  Nervous, Anxious, on Edge 2 3 3 3  $ Control/stop worrying 2 3 3 3  $ Worry too much - different things 2 2 3 3  $ Trouble relaxing 2 2 2 3  $ Restless 3 2 2 2  $ Easily annoyed or irritable 3 3 3 3  $ Afraid - awful might happen 2 2 3 3  $ Total GAD 7 Score 16 17 19 20  $ Anxiety Difficulty Very  difficult Somewhat difficult Somewhat difficult Very difficult     Past Medical History:  Diagnosis Date   Anemia    Anxiety    Heart murmur    IBS (irritable bowel syndrome)    Recurrent kidney stones       ROS As per HPI.    Objective:     BP 112/70   Pulse 86   Temp 98.4 F (36.9 C) (Temporal)   Ht 5' 4"$  (1.626 m)   Wt 231 lb 4 oz (104.9 kg)   SpO2 97%   BMI 39.69 kg/m    Physical Exam Vitals and nursing note reviewed.  Constitutional:      General: She is not in acute distress.    Appearance: She is not ill-appearing, toxic-appearing or diaphoretic.  Cardiovascular:     Rate and Rhythm: Regular rhythm.     Heart sounds: Normal heart sounds. No murmur heard. Pulmonary:     Effort: Pulmonary effort is normal. No respiratory distress.     Breath sounds: Normal breath sounds. No wheezing.  Abdominal:     General: Bowel sounds are normal. There is no distension.  Palpations: Abdomen is soft.     Tenderness: There is no abdominal tenderness. There is no guarding or rebound.  Musculoskeletal:     Right lower leg: No edema.     Left lower leg: No edema.  Skin:    General: Skin is warm.  Neurological:     General: No focal deficit present.     Mental Status: She is alert and oriented to person, place, and time.  Psychiatric:        Mood and Affect: Mood normal.        Behavior: Behavior normal.        Thought Content: Thought content normal.      No results found for any visits on 07/01/22.    The ASCVD Risk score (Arnett DK, et al., 2019) failed to calculate for the following reasons:   The 2019 ASCVD risk score is only valid for ages 87 to 28    Assessment & Plan:   Amanda Clarke was seen today for medical management of chronic issues, insomnia, anxiety and depression.  Diagnoses and all orders for this visit:  Depression, recurrent ([place]) Generalized anxiety disorder Uncontrolled. Denies SI. Referral to psychiatry placed for further evaluation  and management. Continue current regimen for now. She doesn't desire a changed until she sees [place].  -     Ambulatory referral to Psychiatry  Insomnia due to medical condition Uncontrolled. Try ambien as below. Discussed can increase to 10 mg if no improvement in 1 week. Discussed will refill until follow up appt in 3 months once dosage is established. PDMP reviewed, no red flags. Discussed will need UDS and CSA at next appt.  -     zolpidem (AMBIEN) 5 MG tablet; Take 1 tablet (5 mg total) by mouth at bedtime as needed for sleep.  Obesity (BMI 30-39.9) Will send Wegovy RX to [facility] as they did to have better supply. Diet and exercise. -     Semaglutide-Weight Management 1 MG/0.5ML SOAJ; Inject 1 mg into the skin once a week for 28 days. -     Semaglutide-Weight Management 1.7 MG/0.75ML SOAJ; Inject 1.7 mg into the skin once a week for 28 days. -     Semaglutide-Weight Management 2.4 MG/0.75ML SOAJ; Inject 2.4 mg into the skin once a week for 28 days. -     Semaglutide-Weight Management 0.5 MG/0.5ML SOAJ; Inject 0.5 mg into the skin once a week for 28 days.  Anemia, unspecified type Managed by hematology. Has visit with labs next week.   Return in about 3 months (around 09/29/2022) for medication follow up.   The patient indicates understanding of these issues and agrees with the plan.  Gwenlyn Perking, FNP

## 2022-07-01 NOTE — Patient Instructions (Signed)
[facility] [address] [place], [place] [phone]

## 2022-07-02 ENCOUNTER — Other Ambulatory Visit (HOSPITAL_COMMUNITY): Payer: Self-pay

## 2022-07-05 NOTE — Progress Notes (Deleted)
[facility]   [phone] [phone]   Clinic Follow up Note   Patient Care Team: Gwenlyn Perking, FNP as PCP - General (Family Medicine)  Date of Service:  07/05/2022  CHIEF COMPLAINT: f/u of Anemia and fatigue   CURRENT THERAPY:    ASSESSMENT: *** Amanda Clarke is a 42 y.o. female with   No problem-specific Assessment & Plan notes found for this encounter.  ***   PLAN:     SUMMARY OF ONCOLOGIC HISTORY: Oncology History   No history exists.     INTERVAL HISTORY: *** Amanda Clarke is here for a follow up of Anemia and fatigue  She was last seen by me on 12/21/2021 She presents to the clinic    All other systems were reviewed with the patient and are negative.  MEDICAL HISTORY:  Past Medical History:  Diagnosis Date   Anemia    Anxiety    Heart murmur    IBS (irritable bowel syndrome)    Recurrent kidney stones     SURGICAL HISTORY: Past Surgical History:  Procedure Laterality Date   CHOLECYSTECTOMY  2006   COLONOSCOPY W/ BIOPSIES AND POLYPECTOMY  06/2020   needs to repeat in 5 years   Amanda Clarke      I have reviewed the social history and family history with the patient and they are unchanged from previous note.  ALLERGIES:  is allergic to dilaudid [hydromorphone] and trazodone and nefazodone.  MEDICATIONS:  Current Outpatient Medications  Medication Sig Dispense Refill   buPROPion (WELLBUTRIN XL) 150 MG 24 hr tablet Take 1 tablet (150 mg total) by mouth daily. 90 tablet 0   CALCIUM PO Take by mouth.     Cholecalciferol (VITAMIN D3 PO) Take by mouth.     Cyanocobalamin (VITAMIN B-12 PO) Take by mouth.     desvenlafaxine (PRISTIQ) 50 MG 24 hr tablet Take 1 tablet (50 mg total) by mouth daily. 90 tablet 1   Docusate Calcium (STOOL SOFTENER PO) Take by mouth.     FERROUS SULFATE PO Take by mouth.     fluticasone (FLONASE) 50 MCG/ACT nasal spray Place 2 sprays into both nostrils daily. 16 g 6    rOPINIRole (REQUIP) 4 MG tablet Take 1 tablet (4 mg total) by mouth at bedtime. 90 tablet 1   Semaglutide-Weight Management 0.5 MG/0.5ML SOAJ Inject 0.5 mg into the skin once a week for 28 days. 2 mL 0   [START ON 07/06/2022] Semaglutide-Weight Management 1 MG/0.5ML SOAJ Inject 1 mg into the skin once a week for 28 days. 2 mL 0   [START ON 08/04/2022] Semaglutide-Weight Management 1.7 MG/0.75ML SOAJ Inject 1.7 mg into the skin once a week for 28 days. 3 mL 0   [START ON 09/02/2022] Semaglutide-Weight Management 2.4 MG/0.75ML SOAJ Inject 2.4 mg into the skin once a week for 28 days. 3 mL 6   zolpidem (AMBIEN) 5 MG tablet Take 1 tablet (5 mg total) by mouth at bedtime as needed for sleep. 30 tablet 0   No current facility-administered medications for this visit.    PHYSICAL EXAMINATION: ECOG PERFORMANCE STATUS: {CHL ONC ECOG [phone]}  There were no vitals filed for this visit. Wt Readings from Last 3 Encounters:  07/01/22 231 lb 4 oz (104.9 kg)  05/09/22 232 lb (105.2 kg)  04/15/22 224 lb 2 oz (101.7 kg)    {Only keep what was examined. If exam not performed, can use .CEXAM } GENERAL:alert, no distress and comfortable  SKIN: skin color, texture, turgor are normal, no rashes or significant lesions EYES: normal, Conjunctiva are pink and non-injected, sclera clear {OROPHARYNX:no exudate, no erythema and lips, buccal mucosa, and tongue normal}  NECK: supple, thyroid normal size, non-tender, without nodularity LYMPH:  no palpable lymphadenopathy in the cervical, axillary {or inguinal} LUNGS: clear to auscultation and percussion with normal breathing effort HEART: regular rate & rhythm and no murmurs and no lower extremity edema ABDOMEN:abdomen soft, non-tender and normal bowel sounds Musculoskeletal:no cyanosis of digits and no clubbing  NEURO: alert & oriented x 3 with fluent speech, no focal motor/sensory deficits  LABORATORY DATA:  I have reviewed the data as listed    Latest Ref Rng  & Units 04/08/2022   11:13 AM 12/24/2021   11:58 AM 12/24/2021   11:57 AM  CBC  WBC 4.0 - 10.5 K/uL 8.8   9.7   Hemoglobin 12.0 - 15.0 g/dL 10.0   10.4   Hematocrit 36.0 - 46.0 % 32.2  34.8  32.4   Platelets 150 - 400 K/uL 350   352         Latest Ref Rng & Units 08/09/2021    1:11 AM 08/08/2021    5:00 AM 08/07/2021    9:52 PM  CMP  Glucose 70 - 99 mg/dL 98  120  118   BUN 6 - 20 mg/dL '10  13  16   '$ Creatinine 0.44 - 1.00 mg/dL 0.66  0.69  0.72   Sodium 135 - 145 mmol/L 139  139  138   Potassium 3.5 - 5.1 mmol/L 3.8  3.7  3.7   Chloride 98 - 111 mmol/L 107  106  106   CO2 22 - 32 mmol/L '26  23  24   '$ Calcium 8.9 - 10.3 mg/dL 8.7  8.8  9.0   Total Protein 6.5 - 8.1 g/dL  6.5  7.0   Total Bilirubin 0.3 - 1.2 mg/dL  0.5  0.5   Alkaline Phos 38 - 126 U/L  78  79   AST 15 - 41 U/L  16  20   ALT 0 - 44 U/L  23  26       RADIOGRAPHIC STUDIES: I have personally reviewed the radiological images as listed and agreed with the findings in the report. No results found.    No orders of the defined types were placed in this encounter.  All questions were answered. The patient knows to call the clinic with any problems, questions or concerns. No barriers to learning was detected. The total time spent in the appointment was {CHL ONC TIME VISIT - [id number].     Baldemar Friday, [place] 07/05/2022   I, Audry Riles, CMA, am acting as scribe for Truitt Merle, MD.   {Add scribe attestation statement}

## 2022-07-07 NOTE — Assessment & Plan Note (Deleted)
-  she has history of low hgb with hemoglobin 11-12, she she has thalassemia trait. Iron, ferritin, vit B12, and folate levels have been WNL with oral supplementation.  Her menstrual period used to be very heavy, but has been light to moderate in the past many years.  -Will continue monitoring her lab including CBC and iron study, and continue oral supplements.  She has not required IV iron.

## 2022-07-08 ENCOUNTER — Inpatient Hospital Stay: Payer: BC Managed Care – PPO | Attending: Family Medicine

## 2022-07-08 ENCOUNTER — Inpatient Hospital Stay: Payer: BC Managed Care – PPO | Admitting: Hematology

## 2022-07-08 DIAGNOSIS — D509 Iron deficiency anemia, unspecified: Secondary | ICD-10-CM

## 2022-07-09 ENCOUNTER — Telehealth: Payer: Self-pay

## 2022-07-11 ENCOUNTER — Other Ambulatory Visit: Payer: Self-pay

## 2022-07-15 DIAGNOSIS — F411 Generalized anxiety disorder: Secondary | ICD-10-CM | POA: Diagnosis not present

## 2022-07-15 DIAGNOSIS — F331 Major depressive disorder, recurrent, moderate: Secondary | ICD-10-CM | POA: Diagnosis not present

## 2022-07-24 ENCOUNTER — Encounter: Payer: Self-pay | Admitting: *Deleted

## 2022-07-27 ENCOUNTER — Other Ambulatory Visit: Payer: Self-pay | Admitting: Family Medicine

## 2022-07-27 DIAGNOSIS — G4701 Insomnia due to medical condition: Secondary | ICD-10-CM

## 2022-07-29 NOTE — Telephone Encounter (Signed)
Controlled - ntbs  ?

## 2022-07-29 NOTE — Telephone Encounter (Signed)
Called pt stated she did not need a refill on this medication, to disregard this refill request

## 2022-08-04 ENCOUNTER — Other Ambulatory Visit: Payer: Self-pay | Admitting: Family Medicine

## 2022-08-04 DIAGNOSIS — F339 Major depressive disorder, recurrent, unspecified: Secondary | ICD-10-CM

## 2022-08-04 DIAGNOSIS — F5231 Female orgasmic disorder: Secondary | ICD-10-CM

## 2022-08-06 ENCOUNTER — Other Ambulatory Visit: Payer: Self-pay | Admitting: Family Medicine

## 2022-08-06 DIAGNOSIS — F5231 Female orgasmic disorder: Secondary | ICD-10-CM

## 2022-08-06 DIAGNOSIS — F339 Major depressive disorder, recurrent, unspecified: Secondary | ICD-10-CM

## 2022-08-08 DIAGNOSIS — Z87442 Personal history of urinary calculi: Secondary | ICD-10-CM | POA: Diagnosis not present

## 2022-08-08 DIAGNOSIS — R110 Nausea: Secondary | ICD-10-CM | POA: Diagnosis not present

## 2022-08-08 DIAGNOSIS — M549 Dorsalgia, unspecified: Secondary | ICD-10-CM | POA: Diagnosis not present

## 2022-08-08 DIAGNOSIS — R000 Tachycardia, unspecified: Secondary | ICD-10-CM | POA: Diagnosis not present

## 2022-08-08 DIAGNOSIS — Z9049 Acquired absence of other specified parts of digestive tract: Secondary | ICD-10-CM | POA: Diagnosis not present

## 2022-08-08 DIAGNOSIS — R519 Headache, unspecified: Secondary | ICD-10-CM | POA: Diagnosis not present

## 2022-08-17 ENCOUNTER — Other Ambulatory Visit (HOSPITAL_COMMUNITY): Payer: Self-pay

## 2022-08-19 ENCOUNTER — Other Ambulatory Visit (HOSPITAL_COMMUNITY): Payer: Self-pay

## 2022-09-17 ENCOUNTER — Other Ambulatory Visit (HOSPITAL_COMMUNITY): Payer: Self-pay

## 2022-09-18 ENCOUNTER — Telehealth: Payer: Self-pay

## 2022-09-18 DIAGNOSIS — M25511 Pain in right shoulder: Secondary | ICD-10-CM | POA: Diagnosis not present

## 2022-09-18 DIAGNOSIS — M25521 Pain in right elbow: Secondary | ICD-10-CM | POA: Diagnosis not present

## 2022-09-18 NOTE — Telephone Encounter (Signed)
Patient called to see if she could get in for elbow pain - states that the pain has been on going for months following an injury.  Patient states that she felt something snap in her elbow this morning and now she I experiencing 10/10 pain that is causing her to feel sick.  Advised the patient that we did not have any openings today and that the best care for her would be going to an Ortho Urgent Care and that she should go as soon as possible.  Patient expressed understanding and agreed you would go to Urgent Care today.

## 2022-09-27 ENCOUNTER — Ambulatory Visit: Payer: BC Managed Care – PPO | Admitting: Family Medicine

## 2022-09-30 ENCOUNTER — Telehealth: Payer: Self-pay

## 2022-09-30 NOTE — Telephone Encounter (Signed)
Lyndle Herrlich (KeyLinton Ham) Rx #: [id number] 0.5MG /0.5ML auto-injectors  Sent to insurance through cover my meds - 09/30/2022

## 2022-09-30 NOTE — Telephone Encounter (Signed)
Amanda Clarke (Amanda Clarke) Rx #: [id number] 0.5MG /0.5ML auto-injectors  Sent to plan through cover my meds

## 2022-10-01 DIAGNOSIS — G5621 Lesion of ulnar nerve, right upper limb: Secondary | ICD-10-CM | POA: Diagnosis not present

## 2022-10-01 DIAGNOSIS — M25511 Pain in right shoulder: Secondary | ICD-10-CM | POA: Diagnosis not present

## 2022-10-01 DIAGNOSIS — M25521 Pain in right elbow: Secondary | ICD-10-CM | POA: Diagnosis not present

## 2022-10-03 ENCOUNTER — Other Ambulatory Visit (HOSPITAL_COMMUNITY): Payer: Self-pay

## 2022-10-11 ENCOUNTER — Other Ambulatory Visit (HOSPITAL_COMMUNITY): Payer: Self-pay

## 2022-10-13 DIAGNOSIS — M25521 Pain in right elbow: Secondary | ICD-10-CM | POA: Diagnosis not present

## 2022-10-16 ENCOUNTER — Ambulatory Visit: Payer: BC Managed Care – PPO | Admitting: Family Medicine

## 2022-10-21 DIAGNOSIS — G5621 Lesion of ulnar nerve, right upper limb: Secondary | ICD-10-CM | POA: Diagnosis not present

## 2022-10-22 DIAGNOSIS — M25521 Pain in right elbow: Secondary | ICD-10-CM | POA: Diagnosis not present

## 2022-10-22 DIAGNOSIS — G5621 Lesion of ulnar nerve, right upper limb: Secondary | ICD-10-CM | POA: Diagnosis not present

## 2022-10-28 ENCOUNTER — Other Ambulatory Visit: Payer: Self-pay | Admitting: Family Medicine

## 2022-10-28 DIAGNOSIS — G2581 Restless legs syndrome: Secondary | ICD-10-CM

## 2022-10-31 DIAGNOSIS — G5621 Lesion of ulnar nerve, right upper limb: Secondary | ICD-10-CM | POA: Diagnosis not present

## 2022-11-02 ENCOUNTER — Other Ambulatory Visit: Payer: Self-pay | Admitting: Family Medicine

## 2022-11-02 DIAGNOSIS — R000 Tachycardia, unspecified: Secondary | ICD-10-CM | POA: Diagnosis not present

## 2022-11-02 DIAGNOSIS — R002 Palpitations: Secondary | ICD-10-CM | POA: Diagnosis not present

## 2022-11-02 DIAGNOSIS — R072 Precordial pain: Secondary | ICD-10-CM | POA: Diagnosis not present

## 2022-11-02 DIAGNOSIS — Z79899 Other long term (current) drug therapy: Secondary | ICD-10-CM | POA: Diagnosis not present

## 2022-11-02 DIAGNOSIS — M79605 Pain in left leg: Secondary | ICD-10-CM | POA: Diagnosis not present

## 2022-11-02 DIAGNOSIS — R079 Chest pain, unspecified: Secondary | ICD-10-CM | POA: Diagnosis not present

## 2022-11-02 DIAGNOSIS — M79604 Pain in right leg: Secondary | ICD-10-CM | POA: Diagnosis not present

## 2022-11-02 DIAGNOSIS — F339 Major depressive disorder, recurrent, unspecified: Secondary | ICD-10-CM

## 2022-11-02 DIAGNOSIS — F5231 Female orgasmic disorder: Secondary | ICD-10-CM

## 2022-11-02 DIAGNOSIS — R918 Other nonspecific abnormal finding of lung field: Secondary | ICD-10-CM | POA: Diagnosis not present

## 2022-11-02 DIAGNOSIS — R0600 Dyspnea, unspecified: Secondary | ICD-10-CM | POA: Diagnosis not present

## 2022-11-02 DIAGNOSIS — M7989 Other specified soft tissue disorders: Secondary | ICD-10-CM | POA: Diagnosis not present

## 2022-11-02 DIAGNOSIS — R0602 Shortness of breath: Secondary | ICD-10-CM | POA: Diagnosis not present

## 2022-11-02 DIAGNOSIS — Z87442 Personal history of urinary calculi: Secondary | ICD-10-CM | POA: Diagnosis not present

## 2022-11-02 DIAGNOSIS — J984 Other disorders of lung: Secondary | ICD-10-CM | POA: Diagnosis not present

## 2022-11-13 ENCOUNTER — Ambulatory Visit (INDEPENDENT_AMBULATORY_CARE_PROVIDER_SITE_OTHER): Payer: BC Managed Care – PPO

## 2022-11-13 ENCOUNTER — Other Ambulatory Visit (HOSPITAL_COMMUNITY): Payer: Self-pay

## 2022-11-13 ENCOUNTER — Ambulatory Visit: Payer: BC Managed Care – PPO | Admitting: Family Medicine

## 2022-11-13 VITALS — BP 113/66 | HR 94 | Temp 97.9°F | Ht 64.0 in | Wt 246.6 lb

## 2022-11-13 DIAGNOSIS — G2581 Restless legs syndrome: Secondary | ICD-10-CM

## 2022-11-13 DIAGNOSIS — F411 Generalized anxiety disorder: Secondary | ICD-10-CM

## 2022-11-13 DIAGNOSIS — R000 Tachycardia, unspecified: Secondary | ICD-10-CM

## 2022-11-13 DIAGNOSIS — M79672 Pain in left foot: Secondary | ICD-10-CM

## 2022-11-13 DIAGNOSIS — F339 Major depressive disorder, recurrent, unspecified: Secondary | ICD-10-CM

## 2022-11-13 DIAGNOSIS — Z6841 Body Mass Index (BMI) 40.0 and over, adult: Secondary | ICD-10-CM

## 2022-11-13 MED ORDER — SEMAGLUTIDE-WEIGHT MANAGEMENT 1.7 MG/0.75ML ~~LOC~~ SOAJ
1.7000 mg | SUBCUTANEOUS | 0 refills | Status: AC
Start: 2023-02-08 — End: 2023-03-08
  Filled 2022-11-13 – 2023-01-31 (×2): qty 3, 28d supply, fill #0

## 2022-11-13 MED ORDER — BUPROPION HCL 100 MG PO TABS
100.0000 mg | ORAL_TABLET | Freq: Every day | ORAL | 1 refills | Status: DC
Start: 2022-11-13 — End: 2023-07-10
  Filled 2022-11-13: qty 30, 30d supply, fill #0

## 2022-11-13 MED ORDER — SEMAGLUTIDE-WEIGHT MANAGEMENT 0.25 MG/0.5ML ~~LOC~~ SOAJ
0.2500 mg | SUBCUTANEOUS | 0 refills | Status: AC
Start: 2022-11-13 — End: 2022-12-11
  Filled 2022-11-13: qty 2, 28d supply, fill #0

## 2022-11-13 MED ORDER — SEMAGLUTIDE-WEIGHT MANAGEMENT 1 MG/0.5ML ~~LOC~~ SOAJ
1.0000 mg | SUBCUTANEOUS | 0 refills | Status: AC
Start: 2023-01-10 — End: 2023-02-07
  Filled 2022-11-13 – 2023-01-08 (×2): qty 2, 28d supply, fill #0

## 2022-11-13 MED ORDER — SEMAGLUTIDE-WEIGHT MANAGEMENT 2.4 MG/0.75ML ~~LOC~~ SOAJ
2.4000 mg | SUBCUTANEOUS | 6 refills | Status: AC
Start: 2023-03-09 — End: 2023-04-06
  Filled 2022-11-13: qty 3, fill #0

## 2022-11-13 MED ORDER — SEMAGLUTIDE-WEIGHT MANAGEMENT 0.5 MG/0.5ML ~~LOC~~ SOAJ
0.5000 mg | SUBCUTANEOUS | 0 refills | Status: AC
Start: 2022-12-12 — End: 2023-01-09
  Filled 2022-11-13 – 2022-12-10 (×2): qty 2, 28d supply, fill #0

## 2022-11-13 NOTE — Progress Notes (Signed)
Established Patient Office Visit  Subjective   Patient ID: Amanda Clarke, female    DOB: 07-17-1980  Age: 42 y.o. MRN: [id number]  Chief Complaint  Patient presents with   Depression   Weight Check   Foot Pain    Left foot across top x 1 week worse when walks on it pain 8-10. No injury     HPI Amanda Clarke is here for a chronic follow up.   She has stopped taking pristiq. She forgot to pick up a refill. But she feels much better since stopping this however. She has been compliant with Wellbutrin however.   She has been sleeping well lately. She reports that RLS is still bothersome but she is compliant with requip.   She has noticed her heart rate up to 130s at times while she is sitting still. Her watch alerts her for this. Sometimes has dizziness with standing. Was seen in ER recently for shortness of breath. She had a chest xray, normal EKG, and negative lab work up aside from chronic anemia.  She has pain on the top of her foot for 1 week. Pain is worse with weight bearing. Pain is dull, achy at rest. Sharp with activity. No injury. She has has swelling in this foot. Denies erythema, warmth, numbness, tingling.   She did not pick up [facility] to start. She would like this sent back in as the prescription has expired. She has gained weight. She plans to get back on track with her diet and exercise as well.      11/13/2022   10:17 AM 07/01/2022    9:25 AM 05/09/2022    2:42 PM  Depression screen PHQ 2/9  Decreased Interest 1 1 1   Down, Depressed, Hopeless 0 2 2  PHQ - 2 Score 1 3 3   Altered sleeping 0 3 2  Tired, decreased energy 1 3 2   Change in appetite 2 3 3   Feeling bad or failure about yourself  0 1 2  Trouble concentrating 0 1 1  Moving slowly or fidgety/restless 0 2 1  Suicidal thoughts 0 0 0  PHQ-9 Score 4 16 14   Difficult doing work/chores Not difficult at all Very difficult Very difficult      11/13/2022   10:17 AM 07/01/2022    9:25 AM 05/09/2022    2:42 PM  04/15/2022    4:18 PM  GAD 7 : Generalized Anxiety Score  Nervous, Anxious, on Edge 0 2 3 3   Control/stop worrying 0 2 3 3   Worry too much - different things 0 2 2 3   Trouble relaxing 0 2 2 2   Restless 0 3 2 2   Easily annoyed or irritable 1 3 3 3   Afraid - awful might happen 0 2 2 3   Total GAD 7 Score 1 16 17 19   Anxiety Difficulty Not difficult at all Very difficult Somewhat difficult Somewhat difficult       ROS As per HPI.    Objective:     BP 113/66   Pulse 94   Temp 97.9 F (36.6 C) (Temporal)   Ht 5\' 4"  (1.626 m)   Wt 246 lb 9.6 oz (111.9 kg)   SpO2 96%   BMI 42.33 kg/m  Wt Readings from Last 3 Encounters:  11/13/22 246 lb 9.6 oz (111.9 kg)  07/01/22 231 lb 4 oz (104.9 kg)  05/09/22 232 lb (105.2 kg)      Physical Exam Vitals and nursing note reviewed.  Constitutional:  General: She is not in acute distress.    Appearance: She is not ill-appearing, toxic-appearing or diaphoretic.  HENT:     Head: Normocephalic and atraumatic.     Nose: Nose normal.     Mouth/Throat:     Mouth: Mucous membranes are moist.     Pharynx: Oropharynx is clear.  Eyes:     General:        Right eye: No discharge.        Left eye: No discharge.     Extraocular Movements: Extraocular movements intact.     Pupils: Pupils are equal, round, and reactive to light.  Cardiovascular:     Rate and Rhythm: Regular rhythm.     Heart sounds: Normal heart sounds. No murmur heard. Pulmonary:     Effort: Pulmonary effort is normal. No respiratory distress.     Breath sounds: Normal breath sounds. No wheezing.  Abdominal:     General: Bowel sounds are normal. There is no distension.     Palpations: Abdomen is soft.     Tenderness: There is no abdominal tenderness. There is no guarding or rebound.  Musculoskeletal:     Cervical back: Neck supple. No rigidity.     Right lower leg: No edema.     Left lower leg: No edema.     Left foot: Normal range of motion and normal capillary  refill. Swelling (mild) and tenderness (lateral proximal mid dorsal foot) present. No deformity. Normal pulse.  Skin:    General: Skin is warm.  Neurological:     General: No focal deficit present.     Mental Status: She is alert and oriented to person, place, and time.  Psychiatric:        Mood and Affect: Mood normal.        Behavior: Behavior normal.        Thought Content: Thought content normal.      No results found for any visits on 11/13/22.    The 10-year ASCVD risk score (Arnett DK, et al., 2019) is: 0.2%    Assessment & Plan:   Amanda Clarke was seen today for depression, weight check and foot pain.  Diagnoses and all orders for this visit:  Depression, recurrent ([facility]) Generalized anxiety disorder Well controlled on current regimen.  -     buPROPion (WELLBUTRIN) 100 MG tablet; Take 1 tablet (100 mg) by mouth daily.  Restless leg syndrome Continue requip.   Morbid obesity (H[facility]) Patient's BMI is >30 mg/m2.  Patient's current BMI is Body mass index is 42.33 kg/m.Marland Kitchen  Patient is currently enrolled in a healthy eating plan along with encouraged exercise.  Patient has contraindications to phentermine, Contrave & Qsymia (contains phentermine) due to tachycardia.  Patient does not have a personal or family history of medullary thyroid carcinoma (MTC) or Multiple Endocrine Neoplasia syndrome type 2 (MEN 2). -     Semaglutide-Weight Management 0.25 MG/0.5ML SOAJ; Inject 0.25 mg into the skin once a week for 28 days. -     Semaglutide-Weight Management 0.5 MG/0.5ML SOAJ; Inject 0.5 mg into the skin once a week for 28 days. -     Semaglutide-Weight Management 1 MG/0.5ML SOAJ; Inject 1 mg into the skin once a week for 28 days. -     Semaglutide-Weight Management 1.7 MG/0.75ML SOAJ; Inject 1.7 mg into the skin once a week for 28 days. -     Semaglutide-Weight Management 2.4 MG/0.75ML SOAJ; Inject 2.4 mg into the skin once a week for 28 days.  Tachycardia Zio ordered. Reviewed labs  from ER visit on 11/02/22. Will notify patient of results when available.  -     LONG TERM MONITOR (3-14 DAYS); Future  Left foot pain Xray negative. RICE therapy discussed. Return to office for new or worsening symptoms, or if symptoms persist.  -     DG Foot Complete Left; Future   Return in about 3 months (around 02/13/2023) for follow up with anemia labs. .  The patient indicates understanding of these issues and agrees with the plan.   Gabriel Earing, FNP

## 2022-11-15 ENCOUNTER — Telehealth: Payer: Self-pay | Admitting: Family Medicine

## 2022-11-15 NOTE — Telephone Encounter (Signed)
Contacted PCP, Harlow Mares, FNP. Discussed case with provider. Provider noted that patient has chronic anemia, last labs were stable and she may proceed with surgery. Call placed to [facility] at 1513 and relayed message to RN for Dr. Ree Shay.

## 2022-11-18 ENCOUNTER — Encounter: Payer: Self-pay | Admitting: Family Medicine

## 2022-11-18 DIAGNOSIS — G5621 Lesion of ulnar nerve, right upper limb: Secondary | ICD-10-CM | POA: Diagnosis not present

## 2022-12-05 DIAGNOSIS — M25521 Pain in right elbow: Secondary | ICD-10-CM | POA: Diagnosis not present

## 2022-12-10 ENCOUNTER — Other Ambulatory Visit (HOSPITAL_COMMUNITY): Payer: Self-pay

## 2022-12-10 DIAGNOSIS — M25521 Pain in right elbow: Secondary | ICD-10-CM | POA: Diagnosis not present

## 2022-12-18 DIAGNOSIS — M25521 Pain in right elbow: Secondary | ICD-10-CM | POA: Diagnosis not present

## 2023-01-02 DIAGNOSIS — M25521 Pain in right elbow: Secondary | ICD-10-CM | POA: Diagnosis not present

## 2023-01-06 DIAGNOSIS — R000 Tachycardia, unspecified: Secondary | ICD-10-CM | POA: Diagnosis not present

## 2023-01-06 DIAGNOSIS — J168 Pneumonia due to other specified infectious organisms: Secondary | ICD-10-CM | POA: Diagnosis not present

## 2023-01-06 DIAGNOSIS — Z20822 Contact with and (suspected) exposure to covid-19: Secondary | ICD-10-CM | POA: Diagnosis not present

## 2023-01-06 DIAGNOSIS — J189 Pneumonia, unspecified organism: Secondary | ICD-10-CM | POA: Diagnosis not present

## 2023-01-06 DIAGNOSIS — R0602 Shortness of breath: Secondary | ICD-10-CM | POA: Diagnosis not present

## 2023-01-06 DIAGNOSIS — R918 Other nonspecific abnormal finding of lung field: Secondary | ICD-10-CM | POA: Diagnosis not present

## 2023-01-06 DIAGNOSIS — R059 Cough, unspecified: Secondary | ICD-10-CM | POA: Diagnosis not present

## 2023-01-08 ENCOUNTER — Other Ambulatory Visit (HOSPITAL_COMMUNITY): Payer: Self-pay

## 2023-01-10 ENCOUNTER — Other Ambulatory Visit (HOSPITAL_COMMUNITY): Payer: Self-pay

## 2023-01-28 ENCOUNTER — Other Ambulatory Visit: Payer: Self-pay | Admitting: Family Medicine

## 2023-01-28 DIAGNOSIS — G2581 Restless legs syndrome: Secondary | ICD-10-CM

## 2023-01-31 ENCOUNTER — Other Ambulatory Visit (HOSPITAL_COMMUNITY): Payer: Self-pay

## 2023-02-05 ENCOUNTER — Other Ambulatory Visit: Payer: Self-pay | Admitting: Family Medicine

## 2023-02-05 DIAGNOSIS — F339 Major depressive disorder, recurrent, unspecified: Secondary | ICD-10-CM

## 2023-02-05 DIAGNOSIS — F5231 Female orgasmic disorder: Secondary | ICD-10-CM

## 2023-02-14 ENCOUNTER — Ambulatory Visit: Payer: BC Managed Care – PPO | Admitting: Family Medicine

## 2023-02-17 ENCOUNTER — Encounter: Payer: Self-pay | Admitting: Family Medicine

## 2023-04-14 ENCOUNTER — Ambulatory Visit: Payer: BC Managed Care – PPO | Admitting: Family Medicine

## 2023-04-17 ENCOUNTER — Telehealth: Payer: Self-pay | Admitting: Family Medicine

## 2023-04-17 NOTE — Telephone Encounter (Signed)
Pt mother is calling for a appt to get stitches removed

## 2023-04-17 NOTE — Telephone Encounter (Signed)
Spoke with pt correct appt was scheduled

## 2023-05-02 ENCOUNTER — Encounter: Payer: Self-pay | Admitting: Family

## 2023-05-02 ENCOUNTER — Telehealth (INDEPENDENT_AMBULATORY_CARE_PROVIDER_SITE_OTHER): Payer: Self-pay | Admitting: Family

## 2023-05-02 DIAGNOSIS — Z20818 Contact with and (suspected) exposure to other bacterial communicable diseases: Secondary | ICD-10-CM | POA: Diagnosis not present

## 2023-05-02 DIAGNOSIS — J029 Acute pharyngitis, unspecified: Secondary | ICD-10-CM

## 2023-05-02 MED ORDER — AMOXICILLIN 500 MG PO CAPS
500.0000 mg | ORAL_CAPSULE | Freq: Two times a day (BID) | ORAL | 0 refills | Status: AC
Start: 2023-05-02 — End: 2023-05-12

## 2023-05-02 NOTE — Progress Notes (Signed)
Virtual Visit Consent   Amanda Clarke, you are scheduled for a virtual visit with a [facility] provider today. Just as with appointments in the office, your consent must be obtained to participate. Your consent will be active for this visit and any virtual visit you may have with one of our providers in the next 365 days. If you have a MyChart account, a copy of this consent can be sent to you electronically.  As this is a virtual visit, video technology does not allow for your provider to perform a traditional examination. This may limit your provider's ability to fully assess your condition. If your provider identifies any concerns that need to be evaluated in person or the need to arrange testing (such as labs, EKG, etc.), we will make arrangements to do so. Although advances in technology are sophisticated, we cannot ensure that it will always work on either your end or our end. If the connection with a video visit is poor, the visit may have to be switched to a telephone visit. With either a video or telephone visit, we are not always able to ensure that we have a secure connection.  By engaging in this virtual visit, you consent to the provision of healthcare and authorize for your insurance to be billed (if applicable) for the services provided during this visit. Depending on your insurance coverage, you may receive a charge related to this service.  I need to obtain your verbal consent now. Are you willing to proceed with your visit today? Amanda Clarke has provided verbal consent on 05/02/2023 for a virtual visit (video or telephone). Amanda Rodney, FNP  Date: 05/02/2023 11:42 AM  Virtual Visit via Video Note   I, Amanda Clarke, connected with  Amanda Clarke  ([id number], [birth year]) on 05/02/23 at 11:35 AM EST by a video-enabled telemedicine application and verified that I am speaking with the correct person using two identifiers.  Location: Patient: Virtual Visit Location Patient:  Other: work Provider: Pharmacist, community: Home Office   I discussed the limitations of evaluation and management by telemedicine and the availability of in person appointments. The patient expressed understanding and agreed to proceed.    History of Present Illness: Amanda Clarke is a 42 y.o. who identifies as a female who was assigned female at birth, and is being seen today for headache for the last few days and fatigue. She reports her daughter just tested positive for strep throat this morning.   HPI: HPI  Problems:  Patient Active Problem List   Diagnosis Date Noted   Depression, recurrent (HCC) 02/22/2022   Iron deficiency anemia 02/22/2022   Intractable abdominal pain 08/08/2021   Restless leg syndrome 04/16/2021   Generalized anxiety disorder 04/16/2021   Depression, major, single episode, moderate (HCC) 04/16/2021   Class 2 obesity due to excess calories without serious comorbidity with body mass index (BMI) of 35.0 to 35.9 in adult 04/16/2021   Thalassemia trait 04/16/2021   Vitamin B12 deficiency 04/16/2021   Vitamin D deficiency 04/16/2021    Allergies:  Allergies  Allergen Reactions   Dilaudid [Hydromorphone] Itching   Trazodone And Nefazodone Swelling   Medications:  Current Outpatient Medications:    amoxicillin (AMOXIL) 500 MG capsule, Take 1 capsule (500 mg total) by mouth 2 (two) times daily for 10 days., Disp: 20 capsule, Rfl: 0   buPROPion (WELLBUTRIN) 100 MG tablet, Take 1 tablet (100 mg) by mouth daily., Disp: 30 tablet, Rfl: 1   CALCIUM  PO, Take by mouth., Disp: , Rfl:    Cholecalciferol (VITAMIN D3 PO), Take by mouth., Disp: , Rfl:    Cyanocobalamin (VITAMIN B-12 PO), Take by mouth., Disp: , Rfl:    Docusate Calcium (STOOL SOFTENER PO), Take by mouth., Disp: , Rfl:    fluticasone (FLONASE) 50 MCG/ACT nasal spray, Place 2 sprays into both nostrils daily., Disp: 16 g, Rfl: 6   gabapentin (NEURONTIN) 100 MG capsule, Take 100 mg by mouth 3  (three) times daily., Disp: , Rfl:    rOPINIRole (REQUIP) 4 MG tablet, TAKE 1 TABLET(4 MG) BY MOUTH AT BEDTIME, Disp: 90 tablet, Rfl: 0  Observations/Objective: Patient is well-developed, well-nourished in no acute distress.  Resting comfortably  at home.  Head is normocephalic, atraumatic.  No labored breathing.  Speech is clear and coherent with logical content.  Patient is alert and oriented at baseline.  Tonsils erythemas    Assessment and Plan: 1. Exposure to strep throat (Primary) - amoxicillin (AMOXIL) 500 MG capsule; Take 1 capsule (500 mg total) by mouth 2 (two) times daily for 10 days.  Dispense: 20 capsule; Refill: 0  2. Acute pharyngitis, unspecified etiology - amoxicillin (AMOXIL) 500 MG capsule; Take 1 capsule (500 mg total) by mouth 2 (two) times daily for 10 days.  Dispense: 20 capsule; Refill: 0  - Take meds as prescribed - Use a cool mist humidifier  -Use saline nose sprays frequently -Force fluids -For any cough or congestion  Use plain Mucinex- regular strength or max strength is fine -For fever or aces or pains- take tylenol or ibuprofen. -Throat lozenges if help -New toothbrush in 3 days Follow up if symptoms worsen or   Follow Up Instructions: I discussed the assessment and treatment plan with the patient. The patient was provided an opportunity to ask questions and all were answered. The patient agreed with the plan and demonstrated an understanding of the instructions.  A copy of instructions were sent to the patient via MyChart unless otherwise noted below.     The patient was advised to call back or seek an in-person evaluation if the symptoms worsen or if the condition fails to improve as anticipated.    Amanda Rodney, FNP

## 2023-05-05 ENCOUNTER — Other Ambulatory Visit: Payer: Self-pay | Admitting: Family Medicine

## 2023-05-05 DIAGNOSIS — G2581 Restless legs syndrome: Secondary | ICD-10-CM

## 2023-05-08 ENCOUNTER — Ambulatory Visit: Payer: Self-pay | Admitting: Family Medicine

## 2023-05-08 ENCOUNTER — Other Ambulatory Visit: Payer: Self-pay | Admitting: Family Medicine

## 2023-05-08 DIAGNOSIS — G2581 Restless legs syndrome: Secondary | ICD-10-CM

## 2023-05-08 NOTE — Telephone Encounter (Signed)
Copied from CRM [id number]. Topic: Clinical - Medication Refill >> May 08, 2023  9:29 AM Nila Nephew wrote: Most Recent Primary Care Visit:  Provider: Jannifer Rodney A  Department: [facility]  Visit Type: MYCHART VIDEO VISIT  Date: 05/02/2023  Medication: Something to treat yeast infection resulting from recent amoxicillin prescription  Has the patient contacted their pharmacy? No   Is this the correct pharmacy for this prescription? Yes  This is the patient's preferred pharmacy:    [facility] [phone] - [place], [place] - [address] AT [facility] [address] [place] [address] Phone: [phone] Fax: [phone]   Has the prescription been filled recently? No  Is the patient out of the medication? No  Has the patient been seen for an appointment in the last year OR does the patient have an upcoming appointment? Yes  Can we respond through MyChart? Yes  Agent: Please be advised that Rx refills may take up to 3 business days. We ask that you follow-up with your pharmacy.   Chief Complaint: Vaginal itching--possible yeast infection Symptoms: Itching Frequency: x 2 days Pertinent Negatives: Patient denies Abdominal pain, fevers, vaginal pain, nausea/vomiting/diarrhea, vaginal bleeding, or pain with urination. Disposition: [] ED /[] Urgent Care (no appt availability in office) / [] Appointment(In office/virtual)/ []  [facility]/ [] Home Care/ [x] Refused Recommended Disposition /[] [facility]/ [x]  Follow-up with PCP Additional Notes: Patient states that she had a video visit on 05/02/23 and was prescribed Amoxicillin.  Patient states that since two nights ago she has been having symptoms of a yeast infection.  Patient states whenever she takes Amoxicillin, she gets a yeast infection and she wished she would have remembered to be able to ask for something during that last 05/02/23  appointment.  Patient denies any abdominal pain, nausea, vomiting, diarrhea, vaginal bleeding, pain with urination, or fevers.  Patient also wants to wait and see if something could be prescribed without having to make any appointment.  Patient states that she went yesterday and bought some Monistat cream but still wanted to know if her PCP would prescribe something to help take care of this.  Patient is advised that if anything changes or worsens or she is concerned about anything at all, to give [place] a call back or go to an urgent care of go to an emergency room.  Patient verbalized understanding.   Reason for Disposition  All other vaginal symptoms  (Exception: Feels like prior yeast infection, minor abrasion, mild rash < 24 hour duration, mild itching.)  Answer Assessment - Initial Assessment Questions 1. SYMPTOM: "What's the main symptom you're concerned about?" (e.g., pain, itching, dryness)     Uncomfortable itching--no pain 2. LOCATION: "Where is the  Itching located?" (e.g., inside/outside, left/right)     Vagina 3. ONSET: "When did the  Itching  start?"     2 days ago 4. PAIN: "Is there any pain?" If Yes, ask: "How bad is it?" (Scale: 1-10; mild, moderate, severe)   -  MILD (1-3): Doesn't interfere with normal activities.    -  MODERATE (4-7): Interferes with normal activities (e.g., work or school) or awakens from sleep.     -  SEVERE (8-10): Excruciating pain, unable to do any normal activities.     0 5. ITCHING: "Is there any itching?" If Yes, ask: "How bad is it?" (Scale: 1-10; mild, moderate, severe)     9 6. CAUSE: "What do you  think is causing the discharge?" "Have you had the same problem before? What happened then?"     Yeast infection from taking Amoxicllin--this usually happens 7. OTHER SYMPTOMS: "Do you have any other symptoms?" (e.g., fever, itching, vaginal bleeding, pain with urination, injury to genital area, vaginal foreign body)     Denies 8. PREGNANCY: "Is there any  chance you are pregnant?" "When was your last menstrual period?"     No--2 weeks ago  Protocols used: Vaginal Symptoms-A-AH

## 2023-05-09 ENCOUNTER — Ambulatory Visit: Payer: Self-pay | Admitting: Family Medicine

## 2023-05-09 MED ORDER — FLUCONAZOLE 150 MG PO TABS: 150.0000 mg | ORAL_TABLET | Freq: Once | ORAL | 0 refills | Status: AC

## 2023-05-09 NOTE — Addendum Note (Signed)
Addended by: Cleda Daub on: 05/09/2023 01:48 PM   Modules accepted: Orders

## 2023-05-09 NOTE — Telephone Encounter (Signed)
Diflucan sent to pharmacy. Patient notified and verbalized understanding

## 2023-05-09 NOTE — Telephone Encounter (Signed)
Copied from CRM [id number]. Topic: Clinical - Medication Refill >> May 08, 2023  9:29 AM Nila Nephew wrote: Most Recent Primary Care Visit:  Provider: Jannifer Rodney A  Department: [facility]  Visit Type: MYCHART VIDEO VISIT  Date: 05/02/2023  Medication: Something to treat yeast infection resulting from recent amoxicillin prescription  Has the patient contacted their pharmacy? No   Is this the correct pharmacy for this prescription? Yes  This is the patient's preferred pharmacy:    [facility] [phone] - [place], [place] - [address] AT [facility] [address] [place] [address] Phone: [phone] Fax: [phone]   Has the prescription been filled recently? No  Is the patient out of the medication? No  Has the patient been seen for an appointment in the last year OR does the patient have an upcoming appointment? Yes  Can we respond through MyChart? Yes  Agent: Please be advised that Rx refills may take up to 3 business days. We ask that you follow-up with your pharmacy.   Patient called asking about status of medication request. Advised patient that the request has been sent and we are waiting for the provider to review it. Patient advised of 2-3 day timeframe for medications.

## 2023-05-11 DIAGNOSIS — R5381 Other malaise: Secondary | ICD-10-CM | POA: Diagnosis not present

## 2023-05-11 DIAGNOSIS — R399 Unspecified symptoms and signs involving the genitourinary system: Secondary | ICD-10-CM | POA: Diagnosis not present

## 2023-05-11 DIAGNOSIS — R5383 Other fatigue: Secondary | ICD-10-CM | POA: Diagnosis not present

## 2023-05-12 ENCOUNTER — Encounter: Payer: Self-pay | Admitting: Family Medicine

## 2023-05-12 DIAGNOSIS — B379 Candidiasis, unspecified: Secondary | ICD-10-CM

## 2023-05-12 DIAGNOSIS — T3695XA Adverse effect of unspecified systemic antibiotic, initial encounter: Secondary | ICD-10-CM

## 2023-05-12 MED ORDER — FLUCONAZOLE 150 MG PO TABS: 150.0000 mg | ORAL_TABLET | Freq: Once | ORAL | 0 refills | Status: AC

## 2023-05-29 DIAGNOSIS — R399 Unspecified symptoms and signs involving the genitourinary system: Secondary | ICD-10-CM | POA: Diagnosis not present

## 2023-05-29 DIAGNOSIS — M545 Low back pain, unspecified: Secondary | ICD-10-CM | POA: Diagnosis not present

## 2023-05-30 DIAGNOSIS — M5481 Occipital neuralgia: Secondary | ICD-10-CM | POA: Diagnosis not present

## 2023-05-30 DIAGNOSIS — G43709 Chronic migraine without aura, not intractable, without status migrainosus: Secondary | ICD-10-CM | POA: Diagnosis not present

## 2023-06-11 ENCOUNTER — Other Ambulatory Visit: Payer: Self-pay | Admitting: Family Medicine

## 2023-06-11 DIAGNOSIS — G2581 Restless legs syndrome: Secondary | ICD-10-CM

## 2023-06-11 NOTE — Telephone Encounter (Signed)
tiffany pt NTBS 30-d given 05/08/23

## 2023-06-11 NOTE — Telephone Encounter (Signed)
Pt scheduled appt for 06/18/2023

## 2023-06-18 ENCOUNTER — Ambulatory Visit: Payer: Self-pay | Admitting: Family Medicine

## 2023-07-10 ENCOUNTER — Ambulatory Visit (INDEPENDENT_AMBULATORY_CARE_PROVIDER_SITE_OTHER): Payer: BC Managed Care – PPO | Admitting: Family Medicine

## 2023-07-10 DIAGNOSIS — R000 Tachycardia, unspecified: Secondary | ICD-10-CM

## 2023-07-10 DIAGNOSIS — D509 Iron deficiency anemia, unspecified: Secondary | ICD-10-CM

## 2023-07-10 DIAGNOSIS — R600 Localized edema: Secondary | ICD-10-CM | POA: Diagnosis not present

## 2023-07-10 DIAGNOSIS — D649 Anemia, unspecified: Secondary | ICD-10-CM | POA: Diagnosis not present

## 2023-07-10 DIAGNOSIS — F411 Generalized anxiety disorder: Secondary | ICD-10-CM

## 2023-07-10 DIAGNOSIS — D563 Thalassemia minor: Secondary | ICD-10-CM

## 2023-07-10 DIAGNOSIS — G4733 Obstructive sleep apnea (adult) (pediatric): Secondary | ICD-10-CM | POA: Diagnosis not present

## 2023-07-10 DIAGNOSIS — F321 Major depressive disorder, single episode, moderate: Secondary | ICD-10-CM

## 2023-07-10 MED ORDER — TIRZEPATIDE-WEIGHT MANAGEMENT 2.5 MG/0.5ML ~~LOC~~ SOLN: 2.5000 mg | SUBCUTANEOUS | 0 refills | Status: DC

## 2023-07-10 NOTE — Progress Notes (Unsigned)
 Established Patient Office Visit  Subjective   Patient ID: Amanda Clarke, female    DOB: [birth year]  Age: 43 y.o. MRN: [id number]  Chief Complaint  Patient presents with   Obesity    HPI Amanda Clarke would like to discuss options for weight loss. She has taken wegovy in the past and did well with this. Her insurance no longer covers this however. She has been improving her diet without success. She has cut back on salt intake. She has cut out sodas 1 week ago. Drinking water and unsweet tea. She has not been exercising lately does stay active throughout the day.   She has OSA and wears a mouth guard for this. Had sleep study 08/20/2021.  Swelling bilaterally in both feet, sometimes in hands. Will wake up with some swelling in feet sometimes in the morning. Has tried compression socks with improvement. Legs are achy often. No erythema, tenderness.   Reports anxiety and depression currently well controlled. Not currently on medication.      07/10/2023    3:47 PM 11/13/2022   10:17 AM 07/01/2022    9:25 AM  Depression screen PHQ 2/9  Decreased Interest 1 1 1   Down, Depressed, Hopeless 0 0 2  PHQ - 2 Score 1 1 3   Altered sleeping 0 0 3  Tired, decreased energy 1 1 3   Change in appetite 0 2 3  Feeling bad or failure about yourself  0 0 1  Trouble concentrating 0 0 1  Moving slowly or fidgety/restless 0 0 2  Suicidal thoughts 0 0 0  PHQ-9 Score 2 4 16   Difficult doing work/chores Not difficult at all Not difficult at all Very difficult      07/10/2023    3:47 PM 11/13/2022   10:17 AM 07/01/2022    9:25 AM 05/09/2022    2:42 PM  GAD 7 : Generalized Anxiety Score  Nervous, Anxious, on Edge 0 0 2 3  Control/stop worrying 0 0 2 3  Worry too much - different things 0 0 2 2  Trouble relaxing 0 0 2 2  Restless 1 0 3 2  Easily annoyed or irritable 1 1 3 3   Afraid - awful might happen 0 0 2 2  Total GAD 7 Score 2 1 16 17   Anxiety Difficulty Not difficult at all Not difficult at all Very  difficult Somewhat difficult       ROS As per HPI.    Objective:     BP 113/71   Pulse 87   Temp 98.3 F (36.8 C) (Temporal)   Wt 242 lb 3.2 oz (109.9 kg)   SpO2 96%   BMI 41.57 kg/m  Wt Readings from Last 3 Encounters:  07/10/23 242 lb 3.2 oz (109.9 kg)  11/13/22 246 lb 9.6 oz (111.9 kg)  07/01/22 231 lb 4 oz (104.9 kg)       Physical Exam Vitals and nursing note reviewed.  Constitutional:      General: She is not in acute distress.    Appearance: She is not ill-appearing, toxic-appearing or diaphoretic.  Cardiovascular:     Rate and Rhythm: Normal rate and regular rhythm.     Heart sounds: Normal heart sounds. No murmur heard. Pulmonary:     Effort: Pulmonary effort is normal. No respiratory distress.     Breath sounds: Normal breath sounds. No wheezing, rhonchi or rales.  Musculoskeletal:     Right lower leg: No edema.     Left lower leg: No  edema.  Skin:    General: Skin is warm and dry.  Neurological:     General: No focal deficit present.     Mental Status: She is alert.  Psychiatric:        Mood and Affect: Mood normal.        Behavior: Behavior normal.      No results found for any visits on 07/10/23.    The 10-year ASCVD risk score (Arnett DK, et al., 2019) is: 0.2%    Assessment & Plan:   Amanda Clarke was seen today for obesity.  Diagnoses and all orders for this visit:  Morbid obesity (HCC) OSA (obstructive sleep apnea) Tachycardia Discussed that zepbound is now FDA approved for treatment of OSA in patients with obesity. Sleep study on file in media from 08/20/2021.Discussed diet and exercise in addition to zepbound for weight loss. Patient has contraindications to phentermine, Contrave & Qsymia (contains phentermine) due to tachycardia.  Patient does not have a personal or family history of medullary thyroid carcinoma (MTC) or Multiple Endocrine Neoplasia syndrome type 2 (MEN 2). -     tirzepatide (ZEPBOUND) 2.5 MG/0.5ML injection vial;  Inject 2.5 mg into the skin once a week. -     Anemia Profile B -     CMP14+EGFR -     TSH -     Lipid panel  Peripheral edema Discussed low salt diet, elevation, compression socks, weight loss.   Depression, major, single episode, moderate (HCC) GAD Well controlled without medication.   Iron deficiency anemia, unspecified iron deficiency anemia type Thalassemia trait No recently follow up with hematology.  -     Anemia Profile B  Will determine follow up pending labs and determination of zepbound approval.   The patient indicates understanding of these issues and agrees with the plan.  Gabriel Earing, FNP

## 2023-07-11 ENCOUNTER — Encounter: Payer: Self-pay | Admitting: Family Medicine

## 2023-07-11 DIAGNOSIS — R600 Localized edema: Secondary | ICD-10-CM | POA: Insufficient documentation

## 2023-07-11 DIAGNOSIS — G4733 Obstructive sleep apnea (adult) (pediatric): Secondary | ICD-10-CM | POA: Insufficient documentation

## 2023-07-11 DIAGNOSIS — R000 Tachycardia, unspecified: Secondary | ICD-10-CM | POA: Insufficient documentation

## 2023-07-12 LAB — CMP14+EGFR
ALT: 33 IU/L — ABNORMAL HIGH (ref 0–32)
AST: 26 IU/L (ref 0–40)
Albumin: 4.3 g/dL (ref 3.9–4.9)
Alkaline Phosphatase: 110 IU/L (ref 44–121)
BUN/Creatinine Ratio: 19 (ref 9–23)
BUN: 15 mg/dL (ref 6–24)
Bilirubin Total: 0.3 mg/dL (ref 0.0–1.2)
CO2: 19 mmol/L — ABNORMAL LOW (ref 20–29)
Calcium: 9.3 mg/dL (ref 8.7–10.2)
Chloride: 104 mmol/L (ref 96–106)
Creatinine, Ser: 0.8 mg/dL (ref 0.57–1.00)
Globulin, Total: 2.7 g/dL (ref 1.5–4.5)
Glucose: 86 mg/dL (ref 70–99)
Potassium: 4.1 mmol/L (ref 3.5–5.2)
Sodium: 138 mmol/L (ref 134–144)
Total Protein: 7 g/dL (ref 6.0–8.5)
eGFR: 95 mL/min/{1.73_m2} (ref >59)

## 2023-07-12 LAB — ANEMIA PROFILE B
Basophils Absolute: 0.1 10*3/uL (ref 0.0–0.2)
Basos: 0 %
EOS (ABSOLUTE): 0.1 10*3/uL (ref 0.0–0.4)
Eos: 1 %
Ferritin: 190 ng/mL — ABNORMAL HIGH (ref 15–150)
Folate: 12 ng/mL (ref >3.0)
Hematocrit: 35.4 % (ref 34.0–46.6)
Hemoglobin: 10.8 g/dL — ABNORMAL LOW (ref 11.1–15.9)
Immature Grans (Abs): 0.1 10*3/uL (ref 0.0–0.1)
Immature Granulocytes: 1 %
Iron Saturation: 13 % — ABNORMAL LOW (ref 15–55)
Iron: 47 ug/dL (ref 27–159)
Lymphocytes Absolute: 3.4 10*3/uL — ABNORMAL HIGH (ref 0.7–3.1)
Lymphs: 29 %
MCH: 19.4 pg — ABNORMAL LOW (ref 26.6–33.0)
MCHC: 30.5 g/dL — ABNORMAL LOW (ref 31.5–35.7)
MCV: 64 fL — ABNORMAL LOW (ref 79–97)
Monocytes Absolute: 0.7 10*3/uL (ref 0.1–0.9)
Monocytes: 7 %
Neutrophils Absolute: 7.2 10*3/uL — ABNORMAL HIGH (ref 1.4–7.0)
Neutrophils: 62 %
Platelets: 398 10*3/uL (ref 150–450)
RBC: 5.57 x10E6/uL — ABNORMAL HIGH (ref 3.77–5.28)
RDW: 18.1 % — ABNORMAL HIGH (ref 11.7–15.4)
Retic Ct Pct: 2 % (ref 0.6–2.6)
Total Iron Binding Capacity: 361 ug/dL (ref 250–450)
UIBC: 314 ug/dL (ref 131–425)
Vitamin B-12: 549 pg/mL (ref 232–1245)
WBC: 11.5 10*3/uL — ABNORMAL HIGH (ref 3.4–10.8)

## 2023-07-12 LAB — LIPID PANEL
Chol/HDL Ratio: 2.6 ratio (ref 0.0–4.4)
Cholesterol, Total: 151 mg/dL (ref 100–199)
HDL: 59 mg/dL (ref >39)
LDL Chol Calc (NIH): 66 mg/dL (ref 0–99)
Triglycerides: 151 mg/dL — ABNORMAL HIGH (ref 0–149)
VLDL Cholesterol Cal: 26 mg/dL (ref 5–40)

## 2023-07-12 LAB — TSH: TSH: 2.97 u[IU]/mL (ref 0.450–4.500)

## 2023-07-14 ENCOUNTER — Other Ambulatory Visit: Payer: Self-pay | Admitting: *Deleted

## 2023-07-14 ENCOUNTER — Encounter: Payer: Self-pay | Admitting: Family Medicine

## 2023-07-14 DIAGNOSIS — D509 Iron deficiency anemia, unspecified: Secondary | ICD-10-CM

## 2023-07-14 DIAGNOSIS — D563 Thalassemia minor: Secondary | ICD-10-CM

## 2023-07-17 ENCOUNTER — Other Ambulatory Visit (HOSPITAL_COMMUNITY): Payer: Self-pay

## 2023-07-17 ENCOUNTER — Telehealth: Payer: Self-pay

## 2023-07-17 NOTE — Telephone Encounter (Signed)
 Pharmacy Patient Advocate Encounter  Received notification from [facility]  that Prior Authorization for Zepbound 2.5MG /0.5ML pen-injectors has been DENIED.  Insurance wil not cover any anti-obesity medications. I put in the sleep apnea dx code but they still denied it.   PA #/Case ID/Reference #: Candace Cruise

## 2023-07-17 NOTE — Telephone Encounter (Signed)
 Please review

## 2023-07-18 ENCOUNTER — Telehealth: Payer: Self-pay | Admitting: Family Medicine

## 2023-07-18 NOTE — Telephone Encounter (Signed)
 I advised pt that PA was denied and she states the insurance company has a specific form that needs to be filled out. Advised pt to contact insurance and have them fax the appropriate forms to be faxed.

## 2023-07-18 NOTE — Telephone Encounter (Signed)
 Copied from CRM [id number]. Topic: General - Other >> Jul 18, 2023  8:04 AM Antwanette L wrote: Reason for CRM: Patient is calling to get an update on her prior authorization for tirzepatide (ZEPBOUND) 2.5 MG/0.5ML injection vial?. The patient said the the form should go to [employer] of [place]. Also, the patient wants to know how is the form being sent? Patient is requesting a call back at [phone].  Preferred pharmacy  [facility] [phone] [place], [place] - [address] AT [facility] [address] Phone: [phone] Fax: [phone]

## 2023-07-23 ENCOUNTER — Other Ambulatory Visit: Payer: Self-pay | Admitting: Family Medicine

## 2023-08-01 ENCOUNTER — Ambulatory Visit: Admitting: Family Medicine

## 2023-08-06 DIAGNOSIS — N201 Calculus of ureter: Secondary | ICD-10-CM | POA: Diagnosis not present

## 2023-08-06 DIAGNOSIS — D75839 Thrombocytosis, unspecified: Secondary | ICD-10-CM | POA: Diagnosis not present

## 2023-08-06 DIAGNOSIS — R7989 Other specified abnormal findings of blood chemistry: Secondary | ICD-10-CM | POA: Diagnosis not present

## 2023-08-06 DIAGNOSIS — R718 Other abnormality of red blood cells: Secondary | ICD-10-CM | POA: Diagnosis not present

## 2023-08-06 DIAGNOSIS — Z6841 Body Mass Index (BMI) 40.0 and over, adult: Secondary | ICD-10-CM | POA: Diagnosis not present

## 2023-08-06 DIAGNOSIS — D500 Iron deficiency anemia secondary to blood loss (chronic): Secondary | ICD-10-CM | POA: Diagnosis not present

## 2023-08-06 DIAGNOSIS — D563 Thalassemia minor: Secondary | ICD-10-CM | POA: Diagnosis not present

## 2023-08-06 DIAGNOSIS — D509 Iron deficiency anemia, unspecified: Secondary | ICD-10-CM | POA: Diagnosis not present

## 2023-08-13 ENCOUNTER — Encounter: Payer: Self-pay | Admitting: Family Medicine

## 2023-08-13 ENCOUNTER — Ambulatory Visit (INDEPENDENT_AMBULATORY_CARE_PROVIDER_SITE_OTHER): Admitting: Family Medicine

## 2023-08-13 DIAGNOSIS — Z79899 Other long term (current) drug therapy: Secondary | ICD-10-CM

## 2023-08-13 MED ORDER — PHENTERMINE HCL 37.5 MG PO TABS: 37.5000 mg | ORAL_TABLET | Freq: Every day | ORAL | 0 refills | Status: DC

## 2023-08-13 NOTE — Progress Notes (Signed)
   Established Patient Office Visit  Subjective   Patient ID: Amanda Clarke, female    DOB: 1980-10-19  Age: 43 y.o. MRN: [id number]  Chief Complaint  Patient presents with   Obesity    HPI Here for obesity. Chatara has been working on her diet. She has cut out sodas. Eating lighter, smaller portions. She has increased to daily steps to [facility] a day. Working to increase to 8k now. She has also started a softball league. She has lost 5 lbs since her visit 1 month ago. Insurance does not cover wegovy or zepbound. Does not cover zepbound for OSA. She has taken phentermine in the past with good results and without side effects. She has a history of tachycardia but reprots that that her HR has been well controlled. She monitors it closely with her smart watch.   LMP 07/21/23. She is sexually active with her husband who has had a vasectomy.      ROS As per HPI.    Objective:     BP 126/79   Pulse 83   Temp 98.1 F (36.7 C) (Temporal)   Ht 5\' 4"  (1.626 m)   Wt 237 lb 6.4 oz (107.7 kg)   SpO2 97%   BMI 40.75 kg/m  Wt Readings from Last 3 Encounters:  08/13/23 237 lb 6.4 oz (107.7 kg)  07/10/23 242 lb 3.2 oz (109.9 kg)  11/13/22 246 lb 9.6 oz (111.9 kg)      Physical Exam Vitals and nursing note reviewed.  Constitutional:      General: She is not in acute distress.    Appearance: She is obese. She is not ill-appearing, toxic-appearing or diaphoretic.  Cardiovascular:     Rate and Rhythm: Normal rate and regular rhythm.     Heart sounds: Normal heart sounds. No murmur heard. Pulmonary:     Effort: Pulmonary effort is normal.     Breath sounds: Normal breath sounds.  Musculoskeletal:     Right lower leg: No edema.     Left lower leg: No edema.  Skin:    General: Skin is warm and dry.  Neurological:     General: No focal deficit present.     Mental Status: She is alert and oriented to person, place, and time.  Psychiatric:        Mood and Affect: Mood normal.         Behavior: Behavior normal.      No results found for any visits on 08/13/23.    The 10-year ASCVD risk score (Arnett DK, et al., 2019) is: 0.3%    Assessment & Plan:   Lilo was seen today for obesity.  Diagnoses and all orders for this visit:  Morbid obesity (HCC) -     phentermine (ADIPEX-P) 37.5 MG tablet; Take 1 tablet (37.5 mg total) by mouth daily before breakfast.  High risk medication use -     EKG 12-Lead -     Cancel: POCT urine pregnancy -     Pregnancy, urine   Negative urine pregnancy. EKG today with NSR. No changes compared to previous. Phentermine discussed and ordered. Discussed risk, possible side effects. Continue diet and exercise. Follow up in 4 weeks for recheck.     Return in about 4 weeks (around 09/10/2023) for medication follow up.   The patient indicates understanding of these issues and agrees with the plan.  Gabriel Earing, FNP

## 2023-08-14 LAB — PREGNANCY, URINE: Preg Test, Ur: NEGATIVE

## 2023-08-20 DIAGNOSIS — R160 Hepatomegaly, not elsewhere classified: Secondary | ICD-10-CM | POA: Diagnosis not present

## 2023-08-20 DIAGNOSIS — K760 Fatty (change of) liver, not elsewhere classified: Secondary | ICD-10-CM | POA: Diagnosis not present

## 2023-08-20 DIAGNOSIS — Z9049 Acquired absence of other specified parts of digestive tract: Secondary | ICD-10-CM | POA: Diagnosis not present

## 2023-08-20 DIAGNOSIS — D649 Anemia, unspecified: Secondary | ICD-10-CM | POA: Diagnosis not present

## 2023-08-20 DIAGNOSIS — Z6841 Body Mass Index (BMI) 40.0 and over, adult: Secondary | ICD-10-CM | POA: Diagnosis not present

## 2023-08-27 DIAGNOSIS — Z9049 Acquired absence of other specified parts of digestive tract: Secondary | ICD-10-CM | POA: Diagnosis not present

## 2023-08-27 DIAGNOSIS — Z87442 Personal history of urinary calculi: Secondary | ICD-10-CM | POA: Diagnosis not present

## 2023-08-27 DIAGNOSIS — R110 Nausea: Secondary | ICD-10-CM | POA: Diagnosis not present

## 2023-08-27 DIAGNOSIS — M545 Low back pain, unspecified: Secondary | ICD-10-CM | POA: Diagnosis not present

## 2023-08-27 DIAGNOSIS — R109 Unspecified abdominal pain: Secondary | ICD-10-CM | POA: Diagnosis not present

## 2023-08-27 DIAGNOSIS — Z87891 Personal history of nicotine dependence: Secondary | ICD-10-CM | POA: Diagnosis not present

## 2023-08-27 DIAGNOSIS — E669 Obesity, unspecified: Secondary | ICD-10-CM | POA: Diagnosis not present

## 2023-09-11 ENCOUNTER — Ambulatory Visit (INDEPENDENT_AMBULATORY_CARE_PROVIDER_SITE_OTHER): Admitting: Family Medicine

## 2023-09-11 MED ORDER — PHENTERMINE HCL 37.5 MG PO TABS: 37.5000 mg | ORAL_TABLET | Freq: Every day | ORAL | 0 refills | Status: DC

## 2023-09-11 NOTE — Progress Notes (Signed)
   Established Patient Office Visit  Subjective   Patient ID: Amanda Clarke, female    DOB: Jun 06, 1980  Age: 43 y.o. MRN: [id number]  Chief Complaint  Patient presents with   Obesity    HPI Here for obesity. Amanda Clarke has been eating a well balanced diet. 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 As well as walking when she has time. Recently had to stay in hospital with youngest son so hasn't walked as much this week. She has lost 5 lbs on her own prior to starting phentermine . Has lost 11 lbs since starting phentermine  as well. Denies side effects of phentermine .   LMP 08/18/23. She is sexually active with her husband who has had a vasectomy.   Starting weight: 242 lbs   ROS As per HPI.    Objective:     BP 115/71   Pulse 87   Temp 98.1 F (36.7 C) (Temporal)   Ht 5\' 4"  (1.626 m)   Wt 226 lb 3.2 oz (102.6 kg)   SpO2 96%   BMI 38.83 kg/m  Wt Readings from Last 3 Encounters:  09/11/23 226 lb 3.2 oz (102.6 kg)  08/13/23 237 lb 6.4 oz (107.7 kg)  07/10/23 242 lb 3.2 oz (109.9 kg)      Physical Exam Vitals and nursing note reviewed.  Constitutional:      General: She is not in acute distress.    Appearance: She is obese. She is not ill-appearing, toxic-appearing or diaphoretic.  Cardiovascular:     Rate and Rhythm: Normal rate and regular rhythm.     Heart sounds: Normal heart sounds. No murmur heard. Pulmonary:     Effort: Pulmonary effort is normal.     Breath sounds: Normal breath sounds.  Musculoskeletal:     Right lower leg: No edema.     Left lower leg: No edema.  Skin:    General: Skin is warm and dry.  Neurological:     General: No focal deficit present.     Mental Status: She is alert and oriented to person, place, and time.  Psychiatric:        Mood and Affect: Mood normal.        Behavior: Behavior normal.      No results found for any visits on 09/11/23.    The 10-year ASCVD risk score (Arnett DK, et al., 2019) is: 0.3%     Assessment & Plan:   Amanda Clarke was seen today for obesity.  Diagnoses and all orders for this visit:  Morbid obesity (HCC) PDMP reviewed, no red flags. Down 16 lbs all together. Refill provided. Follow up in 4 weeks.  -     phentermine  (ADIPEX-P ) 37.5 MG tablet; Take 1 tablet (37.5 mg total) by mouth daily before breakfast.   Return in about 4 weeks (around 10/09/2023) for medication follow up.   The patient indicates understanding of these issues and agrees with the plan.  [name], FNP

## 2023-10-09 ENCOUNTER — Encounter: Payer: Self-pay | Admitting: Family Medicine

## 2023-10-09 ENCOUNTER — Ambulatory Visit (INDEPENDENT_AMBULATORY_CARE_PROVIDER_SITE_OTHER): Admitting: Family Medicine

## 2023-10-09 MED ORDER — PHENTERMINE HCL 37.5 MG PO TABS: 37.5000 mg | ORAL_TABLET | Freq: Every day | ORAL | 0 refills | Status: DC

## 2023-10-09 NOTE — Progress Notes (Signed)
   Established Patient Office Visit  Subjective   Patient ID: Amanda Clarke, female    DOB: Nov 19, 1980  Age: 43 y.o. MRN: [id number]  Chief Complaint  Patient presents with   Obesity    HPI Here for obesity follow up. Amanda Clarke has been eating a well balanced diet. Currently playing softball 2x a week. Denies side effects with phentermine . She has lost 5 lbs on her own prior to starting phentermine . Has lost 17 lbs since starting phentermine . Denies side effects of phentermine .   LMP 09/18/23. She is sexually active with her husband who has had a vasectomy.   Starting weight: 242 lbs   ROS As per HPI.    Objective:     BP 111/69   Pulse 76   Temp 97.9 F (36.6 C) (Temporal)   Ht 5\' 4"  (1.626 m)   Wt 220 lb 6.4 oz (100 kg)   BMI 37.83 kg/m  Wt Readings from Last 3 Encounters:  10/09/23 220 lb 6.4 oz (100 kg)  09/11/23 226 lb 3.2 oz (102.6 kg)  08/13/23 237 lb 6.4 oz (107.7 kg)      Physical Exam Vitals and nursing note reviewed.  Constitutional:      General: She is not in acute distress.    Appearance: She is obese. She is not ill-appearing, toxic-appearing or diaphoretic.  Cardiovascular:     Rate and Rhythm: Normal rate and regular rhythm.     Heart sounds: Normal heart sounds. No murmur heard. Pulmonary:     Effort: Pulmonary effort is normal.     Breath sounds: Normal breath sounds.  Musculoskeletal:     Right lower leg: No edema.     Left lower leg: No edema.  Skin:    General: Skin is warm and dry.  Neurological:     General: No focal deficit present.     Mental Status: She is alert and oriented to person, place, and time.  Psychiatric:        Mood and Affect: Mood normal.        Behavior: Behavior normal.      No results found for any visits on 10/09/23.    The 10-year ASCVD risk score (Arnett DK, et al., 2019) is: 0.2%    Assessment & Plan:   Amanda Clarke was seen today for obesity.  Diagnoses and all orders for this visit:  Morbid  obesity (HCC) Net loss of 17 lbs with phentermine , diet, exercise. PDMP viewed, no red flags. Follow up in 1 months.  -     phentermine  (ADIPEX-P ) 37.5 MG tablet; Take 1 tablet (37.5 mg total) by mouth daily before breakfast.   The patient indicates understanding of these issues and agrees with the plan.  [name], FNP

## 2023-10-31 DIAGNOSIS — G43709 Chronic migraine without aura, not intractable, without status migrainosus: Secondary | ICD-10-CM | POA: Diagnosis not present

## 2023-11-05 DIAGNOSIS — K760 Fatty (change of) liver, not elsewhere classified: Secondary | ICD-10-CM | POA: Diagnosis not present

## 2023-11-05 DIAGNOSIS — R160 Hepatomegaly, not elsewhere classified: Secondary | ICD-10-CM | POA: Diagnosis not present

## 2023-11-05 DIAGNOSIS — D500 Iron deficiency anemia secondary to blood loss (chronic): Secondary | ICD-10-CM | POA: Diagnosis not present

## 2023-11-05 DIAGNOSIS — R5383 Other fatigue: Secondary | ICD-10-CM | POA: Diagnosis not present

## 2023-11-05 DIAGNOSIS — D563 Thalassemia minor: Secondary | ICD-10-CM | POA: Diagnosis not present

## 2023-11-05 DIAGNOSIS — R7989 Other specified abnormal findings of blood chemistry: Secondary | ICD-10-CM | POA: Diagnosis not present

## 2023-11-06 ENCOUNTER — Ambulatory Visit (INDEPENDENT_AMBULATORY_CARE_PROVIDER_SITE_OTHER): Admitting: Family Medicine

## 2023-11-06 ENCOUNTER — Ambulatory Visit: Admitting: Family Medicine

## 2023-11-06 ENCOUNTER — Encounter: Payer: Self-pay | Admitting: Family Medicine

## 2023-11-06 MED ORDER — PHENTERMINE HCL 37.5 MG PO TABS: 37.5000 mg | ORAL_TABLET | Freq: Every day | ORAL | 0 refills | Status: DC

## 2023-11-06 NOTE — Progress Notes (Signed)
   Established Patient Office Visit  Subjective   Patient ID: Amanda Clarke, female    DOB: Dec 20, 1980  Age: 43 y.o. MRN: [id number]  Chief Complaint  Patient presents with   Obesity    HPI Here for obesity follow up. Amanda Clarke has been eating a well balanced diet. Denies side effects with phentermine . She has been moving houses and so has been doing a lot of activity and lifting with this. She has lost 2 lbs since her last visit. She has lost 5 lbs on her own prior to starting phentermine . Has lost 19 lbs since starting phentermine . Denies side effects of phentermine .   LMP 5/125. She is sexually active with her husband who has had a vasectomy.   Starting weight: 242 lbs   ROS As per HPI.    Objective:     BP 111/72   Pulse 96   Temp 98.1 F (36.7 C) (Temporal)   Ht 5' 4 (1.626 m)   Wt 218 lb 9.6 oz (99.2 kg)   SpO2 98%   BMI 37.52 kg/m  Wt Readings from Last 3 Encounters:  11/06/23 218 lb 9.6 oz (99.2 kg)  10/09/23 220 lb 6.4 oz (100 kg)  09/11/23 226 lb 3.2 oz (102.6 kg)      Physical Exam Vitals and nursing note reviewed.  Constitutional:      General: She is not in acute distress.    Appearance: She is obese. She is not ill-appearing, toxic-appearing or diaphoretic.   Cardiovascular:     Rate and Rhythm: Normal rate and regular rhythm.     Heart sounds: Normal heart sounds. No murmur heard. Pulmonary:     Effort: Pulmonary effort is normal.     Breath sounds: Normal breath sounds.   Musculoskeletal:     Right lower leg: No edema.     Left lower leg: No edema.   Skin:    General: Skin is warm and dry.   Neurological:     General: No focal deficit present.     Mental Status: She is alert and oriented to person, place, and time.   Psychiatric:        Mood and Affect: Mood normal.        Behavior: Behavior normal.      No results found for any visits on 11/06/23.    The 10-year ASCVD risk score (Arnett DK, et al., 2019) is: 0.2%     Assessment & Plan:   Amanda Clarke was seen today for obesity.  Diagnoses and all orders for this visit:  Morbid obesity (HCC) Net loss of 19 lbs with phentermine , diet, exercise. PDMP viewed, no red flags. Follow up in 1 month.  -     phentermine  (ADIPEX-P ) 37.5 MG tablet; Take 1 tablet (37.5 mg total) by mouth daily before breakfast.   The patient indicates understanding of these issues and agrees with the plan.  Amanda CHRISTELLA Search, FNP

## 2023-11-07 ENCOUNTER — Ambulatory Visit: Admitting: Family Medicine

## 2023-11-27 ENCOUNTER — Other Ambulatory Visit: Payer: Self-pay | Admitting: Family Medicine

## 2023-11-29 ENCOUNTER — Telehealth: Admitting: Nurse Practitioner

## 2023-11-29 ENCOUNTER — Other Ambulatory Visit: Payer: Self-pay | Admitting: Nurse Practitioner

## 2023-11-29 DIAGNOSIS — B9689 Other specified bacterial agents as the cause of diseases classified elsewhere: Secondary | ICD-10-CM | POA: Diagnosis not present

## 2023-11-29 DIAGNOSIS — N760 Acute vaginitis: Secondary | ICD-10-CM | POA: Diagnosis not present

## 2023-11-29 DIAGNOSIS — B379 Candidiasis, unspecified: Secondary | ICD-10-CM

## 2023-11-29 MED ORDER — METRONIDAZOLE 500 MG PO TABS: 500.0000 mg | ORAL_TABLET | Freq: Two times a day (BID) | ORAL | 0 refills | Status: AC

## 2023-11-29 MED ORDER — FLUCONAZOLE 150 MG PO TABS: 150.0000 mg | ORAL_TABLET | ORAL | 0 refills | Status: DC | PRN

## 2023-11-29 NOTE — Progress Notes (Signed)

## 2023-12-19 ENCOUNTER — Ambulatory Visit: Admitting: Family Medicine

## 2023-12-22 ENCOUNTER — Telehealth: Admitting: Physician Assistant

## 2023-12-22 DIAGNOSIS — N760 Acute vaginitis: Secondary | ICD-10-CM

## 2023-12-22 NOTE — Progress Notes (Signed)
  Because you are having recurring symptoms, I feel your condition warrants further evaluation and I recommend that you be seen in a face-to-face visit. You will need to have an in person exam to check vaginal swabs.    NOTE: There will be NO CHARGE for this E-Visit   If you are having a true medical emergency, please call 911.     For an urgent face to face visit, [facility] has multiple urgent care centers for your convenience.  Click the link below for the full list of locations and hours, walk-in wait times, appointment scheduling options and driving directions:  Urgent Care - [place], [place], [place], [place], [place], [place]  [place]     Your MyChart E-visit questionnaire answers were reviewed by a board certified advanced clinical practitioner to complete your personal care plan based on your specific symptoms.    Thank you for using e-Visits.   Approximately 5 minutes was spent documenting and reviewing patient's chart.

## 2023-12-26 ENCOUNTER — Ambulatory Visit (INDEPENDENT_AMBULATORY_CARE_PROVIDER_SITE_OTHER): Admitting: Family Medicine

## 2023-12-26 ENCOUNTER — Encounter: Payer: Self-pay | Admitting: Family Medicine

## 2023-12-26 MED ORDER — PHENTERMINE HCL 37.5 MG PO TABS: 37.5000 mg | ORAL_TABLET | Freq: Every day | ORAL | 0 refills | Status: DC

## 2023-12-26 NOTE — Progress Notes (Signed)
   Established Patient Office Visit  Subjective   Patient ID: Amanda Clarke, female    DOB: 11/30/1980  Age: 43 y.o. MRN: [id number]  Chief Complaint  Patient presents with   Obesity    HPI Here for obesity follow up. Onica has been eating a well balanced diet. Denies side effects with phentermine . She has been walking for exercise. She has lost 8 lbs since her last visit. She has lost 5 lbs on her own prior to starting phentermine . Has lost 32 lbs since starting phentermine . Denies side effects of phentermine .   LMP 12/02/23. She is sexually active with her husband who has had a vasectomy.   Starting weight: 242 lbs   ROS As per HPI.    Objective:     BP 105/68   Pulse 82   Temp 98.1 F (36.7 C) (Temporal)   Ht 5' 4 (1.626 m)   Wt 210 lb 12.8 oz (95.6 kg)   SpO2 97%   BMI 36.18 kg/m  Wt Readings from Last 3 Encounters:  12/26/23 210 lb 12.8 oz (95.6 kg)  11/06/23 218 lb 9.6 oz (99.2 kg)  10/09/23 220 lb 6.4 oz (100 kg)      Physical Exam Vitals and nursing note reviewed.  Constitutional:      General: She is not in acute distress.    Appearance: She is obese. She is not ill-appearing, toxic-appearing or diaphoretic.  Cardiovascular:     Rate and Rhythm: Normal rate and regular rhythm.     Heart sounds: Normal heart sounds. No murmur heard. Pulmonary:     Effort: Pulmonary effort is normal.     Breath sounds: Normal breath sounds.  Musculoskeletal:     Right lower leg: No edema.     Left lower leg: No edema.  Skin:    General: Skin is warm and dry.  Neurological:     General: No focal deficit present.     Mental Status: She is alert and oriented to person, place, and time.  Psychiatric:        Mood and Affect: Mood normal.        Behavior: Behavior normal.    No results found for any visits on 12/26/23.   The 10-year ASCVD risk score (Arnett DK, et al., 2019) is: 0.2%    Assessment & Plan:   Mathilda was seen today for obesity.  Diagnoses  and all orders for this visit:  Morbid obesity (HCC) Net loss of 32 lbs with phentermine , diet, exercise. PDMP viewed, no red flags. Refill provided for 5th month. Discussed will wean off in 6th month. Follow up in 1 month.  -     phentermine  (ADIPEX-P ) 37.5 MG tablet; Take 1 tablet (37.5 mg total) by mouth daily before breakfast.   The patient indicates understanding of these issues and agrees with the plan.  [name], FNP

## 2024-01-02 DIAGNOSIS — N920 Excessive and frequent menstruation with regular cycle: Secondary | ICD-10-CM | POA: Diagnosis not present

## 2024-01-02 DIAGNOSIS — Z124 Encounter for screening for malignant neoplasm of cervix: Secondary | ICD-10-CM | POA: Diagnosis not present

## 2024-01-02 DIAGNOSIS — Z1231 Encounter for screening mammogram for malignant neoplasm of breast: Secondary | ICD-10-CM | POA: Diagnosis not present

## 2024-01-02 DIAGNOSIS — N761 Subacute and chronic vaginitis: Secondary | ICD-10-CM | POA: Diagnosis not present

## 2024-02-02 ENCOUNTER — Ambulatory Visit (INDEPENDENT_AMBULATORY_CARE_PROVIDER_SITE_OTHER): Admitting: Family Medicine

## 2024-02-02 ENCOUNTER — Encounter: Payer: Self-pay | Admitting: Family Medicine

## 2024-02-02 DIAGNOSIS — G4733 Obstructive sleep apnea (adult) (pediatric): Secondary | ICD-10-CM | POA: Diagnosis not present

## 2024-02-02 DIAGNOSIS — R928 Other abnormal and inconclusive findings on diagnostic imaging of breast: Secondary | ICD-10-CM

## 2024-02-02 DIAGNOSIS — F339 Major depressive disorder, recurrent, unspecified: Secondary | ICD-10-CM | POA: Diagnosis not present

## 2024-02-02 DIAGNOSIS — Z23 Encounter for immunization: Secondary | ICD-10-CM

## 2024-02-02 DIAGNOSIS — Z6835 Body mass index (BMI) 35.0-35.9, adult: Secondary | ICD-10-CM

## 2024-02-02 MED ORDER — PHENTERMINE HCL 37.5 MG PO TABS: 37.5000 mg | ORAL_TABLET | Freq: Every day | ORAL | 0 refills | Status: DC

## 2024-02-02 NOTE — Addendum Note (Signed)
 Addended by: JOESPH [name] HERO on: 02/02/2024 08:35 AM   Modules accepted: Level of Service

## 2024-02-02 NOTE — Progress Notes (Addendum)
 Established Patient Office Visit  Subjective   Patient ID: Amanda Clarke, female    DOB: 09/25/1980  Age: 43 y.o. MRN: [id number]  Chief Complaint  Patient presents with   Obesity    HPI  History of Present Illness   Amanda Clarke is a 43 year old female who presents for a follow-up on phentermine  use for weight loss.  Weight management - Lost a total of 36 pounds since starting phentermine  - Lost an additional 4 pounds since last visit one month ago  Appetite suppressant therapy - Currently taking phentermine  for weight loss - No side effects from phentermine  use  Dietary habits - Diet improved. Eating well balanced.  - Consuming smaller portions for lunch and dinner and eating 3 eggs for breakfast  Physical activity - Primary form of exercise is walking during a 30-minute lunch break       LMP 01/31/24. She is sexually active with her husband who has had a vasectomy.   Starting weight: 242 lbs     02/02/2024    8:21 AM 12/26/2023   11:11 AM 11/06/2023    3:23 PM  Depression screen PHQ 2/9  Decreased Interest 0 0 1  Down, Depressed, Hopeless 0 0 0  PHQ - 2 Score 0 0 1  Altered sleeping 0 1 0  Tired, decreased energy 1 1 2   Change in appetite 0 0 0  Feeling bad or failure about yourself  0 0 0  Trouble concentrating 0 0 0  Moving slowly or fidgety/restless 0 0 0  Suicidal thoughts 0 0 0  PHQ-9 Score 1 2 3   Difficult doing work/chores Not difficult at all Not difficult at all Somewhat difficult      02/02/2024    8:21 AM 12/26/2023   11:11 AM 11/06/2023    3:24 PM 10/09/2023    2:34 PM  GAD 7 : Generalized Anxiety Score  Nervous, Anxious, on Edge 1 1 0 0  Control/stop worrying 0 1 0 0  Worry too much - different things 0 0 0 0  Trouble relaxing 0 0 0 0  Restless 0 0 0 0  Easily annoyed or irritable 1 1 0 0  Afraid - awful might happen 0 0 0 0  Total GAD 7 Score 2 3 0 0  Anxiety Difficulty Not difficult at all Not difficult at all Not difficult at  all Not difficult at all      ROS As per HPI.    Objective:     BP 110/71   Pulse 77   Temp 98 F (36.7 C) (Temporal)   Ht 5' 4 (1.626 m)   Wt 206 lb (93.4 kg)   SpO2 99%   BMI 35.36 kg/m  Wt Readings from Last 3 Encounters:  02/02/24 206 lb (93.4 kg)  12/26/23 210 lb 12.8 oz (95.6 kg)  11/06/23 218 lb 9.6 oz (99.2 kg)      Physical Exam Vitals and nursing note reviewed.  Constitutional:      General: She is not in acute distress.    Appearance: She is obese. She is not ill-appearing, toxic-appearing or diaphoretic.  Cardiovascular:     Rate and Rhythm: Normal rate and regular rhythm.     Heart sounds: Normal heart sounds. No murmur heard. Pulmonary:     Effort: Pulmonary effort is normal.     Breath sounds: Normal breath sounds.  Musculoskeletal:     Right lower leg: No edema.     Left  lower leg: No edema.  Skin:    General: Skin is warm and dry.  Neurological:     General: No focal deficit present.     Mental Status: She is alert and oriented to person, place, and time.  Psychiatric:        Mood and Affect: Mood normal.        Behavior: Behavior normal.    No results found for any visits on 02/02/24.   The 10-year ASCVD risk score (Arnett DK, et al., 2019) is: 0.2%    Assessment & Plan:   [name] was seen today for obesity.  Diagnoses and all orders for this visit:  Morbid obesity (HCC) -     phentermine  (ADIPEX-P ) 37.5 MG tablet; Take 1 tablet (37.5 mg total) by mouth daily before breakfast.  OSA (obstructive sleep apnea)  Depression, recurrent (HCC)  Abnormal mammogram  Encounter for immunization -     Flu vaccine trivalent PF, 6mos and older(Flulaval,Afluria,Fluarix,Fluzone)       Morbid Obesity BMI 35 with depression, OSA. Obesity managed with phentermine  and lifestyle modification, resulting in a total weight loss of 36 pounds. No side effects from phentermine  reported. - Prescribe 30 pills of phentermine  for the sixth month.  Discussed will discontinue phentermine  after this completion - Advise taking phentermine  every other day during the last week or two of the sixth month. - Emphasized maintaining a healthy diet and regular exercise to sustain weight loss after discontinuing phentermine .  Depression Well controlled without medication currently.   Abnormal mammogram Recent mammogram indicated dense breast tissue, necessitating a follow-up ultrasound. Dense breast tissue can obscure mammogram results, often requiring additional imaging. - Advise attending the scheduled follow-up ultrasound.     Flu vaccine today in office.   Return in about 23 weeks (around 07/12/2024) for CPE.   The patient indicates understanding of these issues and agrees with the plan.  [name], FNP

## 2024-02-05 DIAGNOSIS — R92322 Mammographic fibroglandular density, left breast: Secondary | ICD-10-CM | POA: Diagnosis not present

## 2024-02-05 DIAGNOSIS — R928 Other abnormal and inconclusive findings on diagnostic imaging of breast: Secondary | ICD-10-CM | POA: Diagnosis not present

## 2024-04-22 ENCOUNTER — Ambulatory Visit: Admitting: Family Medicine

## 2024-04-22 VITALS — BP 109/71 | HR 82 | Temp 98.1°F | Ht 64.0 in | Wt 205.2 lb

## 2024-04-22 DIAGNOSIS — F419 Anxiety disorder, unspecified: Secondary | ICD-10-CM

## 2024-04-22 DIAGNOSIS — F331 Major depressive disorder, recurrent, moderate: Secondary | ICD-10-CM

## 2024-04-22 DIAGNOSIS — R4586 Emotional lability: Secondary | ICD-10-CM | POA: Diagnosis not present

## 2024-04-22 NOTE — Progress Notes (Signed)
 Acute Office Visit  Subjective:     Patient ID: Amanda Clarke, female    DOB: Jul 11, 1980, 43 y.o.   MRN: [id number]  Chief Complaint  Patient presents with   Depression    Depression         History of Present Illness   Amanda Clarke is a 43 year old female who presents with mood swings and concerns about possible bipolar disorder.  She has experienced mood swings for several years, characterized by episodes where minor irritations lead to significant anger and withdrawal. These mood changes result in prolonged periods of irritability and conflict with others.  She has previously tried medications for depression and anxiety but found them unhelpful and is currently not on any psychiatric medications. She is concerned about medication side effects, particularly weight gain, which she experienced in the past and has since worked hard to lose.  Her mother has a history of bipolar disorder, which the patient describes as being 'pretty bad.' She lived with her mother for six months recently, which has influenced her awareness of her own symptoms. She is concerned that bipolar disorder may be the root of her symptoms as well.   In terms of sleep, she reports no periods of decreased need for sleep or episodes of high energy.  Her social history includes a recent move, which has increased her travel time to appointments, but she values continuity of care and prefers to maintain her current healthcare providers.          04/22/2024    3:32 PM 02/02/2024    8:21 AM 12/26/2023   11:11 AM  Depression screen PHQ 2/9  Decreased Interest 1 0 0  Down, Depressed, Hopeless 1 0 0  PHQ - 2 Score 2 0 0  Altered sleeping 0 0 1  Tired, decreased energy 3 1 1   Change in appetite 1 0 0  Feeling bad or failure about yourself  0 0 0  Trouble concentrating 1 0 0  Moving slowly or fidgety/restless 0 0 0  Suicidal thoughts 0 0 0  PHQ-9 Score 7 1  2    Difficult doing work/chores Very difficult  Not difficult at all Not difficult at all     Data saved with a previous flowsheet row definition      04/22/2024    3:32 PM 02/02/2024    8:21 AM 12/26/2023   11:11 AM 11/06/2023    3:24 PM  GAD 7 : Generalized Anxiety Score  Nervous, Anxious, on Edge 0 1 1 0  Control/stop worrying 1 0 1 0  Worry too much - different things 1 0 0 0  Trouble relaxing 1 0 0 0  Restless 2 0 0 0  Easily annoyed or irritable 3 1 1  0  Afraid - awful might happen 0 0 0 0  Total GAD 7 Score 8 2 3  0  Anxiety Difficulty Very difficult Not difficult at all Not difficult at all Not difficult at all     Review of Systems  Psychiatric/Behavioral:  Positive for depression.    As per HPI.      Objective:    BP 109/71   Pulse 82   Temp 98.1 F (36.7 C) (Temporal)   Ht 5' 4 (1.626 m)   Wt 205 lb 3.2 oz (93.1 kg)   SpO2 97%   BMI 35.22 kg/m  Wt Readings from Last 3 Encounters:  04/22/24 205 lb 3.2 oz (93.1 kg)  02/02/24 206 lb (93.4 kg)  12/26/23 210 lb 12.8 oz (95.6 kg)      Physical Exam Vitals and nursing note reviewed.  Constitutional:      General: She is not in acute distress.    Appearance: She is obese. She is not ill-appearing, toxic-appearing or diaphoretic.  Pulmonary:     Effort: No respiratory distress.  Musculoskeletal:     Right lower leg: No edema.     Left lower leg: No edema.  Skin:    General: Skin is warm and dry.  Neurological:     General: No focal deficit present.     Mental Status: She is alert and oriented to person, place, and time.  Psychiatric:        Mood and Affect: Mood normal.        Behavior: Behavior normal.     No results found for any visits on 04/22/24.      Assessment & Plan:   Nicosha was seen today for depression.  Diagnoses and all orders for this visit:  Mood swings -     Ambulatory referral to Psychiatry  Depression, major, recurrent, moderate (HCC) -     Ambulatory referral to Psychiatry  Anxiety -     Ambulatory referral to  Psychiatry   Assessment and Plan    Mood swings Major depressive disorder, recurrent, moderate Anxiety disorder Mood swings and irritability with family history may suggest bipolar disorder with depression. No mania reported. Moderate symptoms of depression, denies SI. Anxiety present. Failed SSRIs trials in past.  - Referred to psychiatrist for evaluation.      The patient indicates understanding of these issues and agrees with the plan.  [name], FNP
